# Patient Record
Sex: Male | Born: 1960 | Race: White | Hispanic: No | Marital: Married | Smoking: Never smoker
Health system: Southern US, Community
[De-identification: ages and names within clinical notes are randomized; demographics above are authoritative.]

## PROBLEM LIST (undated history)

## (undated) DIAGNOSIS — E785 Hyperlipidemia, unspecified: Secondary | ICD-10-CM

## (undated) DIAGNOSIS — E669 Obesity, unspecified: Secondary | ICD-10-CM

## (undated) DIAGNOSIS — Z972 Presence of dental prosthetic device (complete) (partial): Secondary | ICD-10-CM

## (undated) DIAGNOSIS — K760 Fatty (change of) liver, not elsewhere classified: Secondary | ICD-10-CM

## (undated) DIAGNOSIS — R739 Hyperglycemia, unspecified: Secondary | ICD-10-CM

## (undated) DIAGNOSIS — M0230 Reiter's disease, unspecified site: Secondary | ICD-10-CM

## (undated) DIAGNOSIS — E119 Type 2 diabetes mellitus without complications: Secondary | ICD-10-CM

## (undated) DIAGNOSIS — M1990 Unspecified osteoarthritis, unspecified site: Secondary | ICD-10-CM

## (undated) DIAGNOSIS — I10 Essential (primary) hypertension: Secondary | ICD-10-CM

## (undated) DIAGNOSIS — R1310 Dysphagia, unspecified: Secondary | ICD-10-CM

## (undated) HISTORY — PX: EYE SURGERY: SHX253

## (undated) HISTORY — PX: BRAIN SURGERY: SHX531

## (undated) HISTORY — PX: CATARACT EXTRACTION: SUR2

## (undated) HISTORY — PX: LEG SURGERY: SHX1003

---
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
## 2004-09-15 ENCOUNTER — Ambulatory Visit: Payer: Self-pay | Admitting: Ophthalmology

## 2004-09-20 ENCOUNTER — Ambulatory Visit: Payer: Self-pay | Admitting: Ophthalmology

## 2006-11-27 ENCOUNTER — Emergency Department: Payer: Self-pay | Admitting: Emergency Medicine

## 2007-07-22 ENCOUNTER — Ambulatory Visit: Payer: Self-pay | Admitting: Emergency Medicine

## 2008-09-20 ENCOUNTER — Emergency Department: Payer: Self-pay | Admitting: Emergency Medicine

## 2008-10-01 ENCOUNTER — Emergency Department: Payer: Self-pay | Admitting: Emergency Medicine

## 2011-07-13 ENCOUNTER — Emergency Department: Payer: Self-pay | Admitting: *Deleted

## 2012-10-16 DIAGNOSIS — M0230 Reiter's disease, unspecified site: Secondary | ICD-10-CM

## 2012-10-16 HISTORY — DX: Reiter's disease, unspecified site: M02.30

## 2014-07-08 DIAGNOSIS — I10 Essential (primary) hypertension: Secondary | ICD-10-CM | POA: Diagnosis not present

## 2014-07-08 DIAGNOSIS — R748 Abnormal levels of other serum enzymes: Secondary | ICD-10-CM | POA: Diagnosis not present

## 2014-07-08 DIAGNOSIS — E784 Other hyperlipidemia: Secondary | ICD-10-CM | POA: Diagnosis not present

## 2014-09-02 DIAGNOSIS — J111 Influenza due to unidentified influenza virus with other respiratory manifestations: Secondary | ICD-10-CM | POA: Diagnosis not present

## 2014-09-23 DIAGNOSIS — Z23 Encounter for immunization: Secondary | ICD-10-CM | POA: Diagnosis not present

## 2014-09-23 DIAGNOSIS — Z9119 Patient's noncompliance with other medical treatment and regimen: Secondary | ICD-10-CM | POA: Diagnosis not present

## 2014-09-23 DIAGNOSIS — I10 Essential (primary) hypertension: Secondary | ICD-10-CM | POA: Diagnosis not present

## 2014-09-23 DIAGNOSIS — M0239 Reiter's disease, multiple sites: Secondary | ICD-10-CM | POA: Diagnosis not present

## 2014-09-23 DIAGNOSIS — M7989 Other specified soft tissue disorders: Secondary | ICD-10-CM | POA: Diagnosis not present

## 2014-10-07 DIAGNOSIS — R739 Hyperglycemia, unspecified: Secondary | ICD-10-CM | POA: Diagnosis not present

## 2014-10-07 DIAGNOSIS — I10 Essential (primary) hypertension: Secondary | ICD-10-CM | POA: Diagnosis not present

## 2014-12-03 ENCOUNTER — Encounter: Payer: Self-pay | Admitting: *Deleted

## 2014-12-03 ENCOUNTER — Emergency Department
Admission: EM | Admit: 2014-12-03 | Discharge: 2014-12-03 | Disposition: A | Discharge status: Home / Self Care | Payer: Commercial Managed Care - HMO | Attending: Emergency Medicine | Admitting: Emergency Medicine

## 2014-12-03 DIAGNOSIS — K645 Perianal venous thrombosis: Secondary | ICD-10-CM | POA: Diagnosis not present

## 2014-12-03 DIAGNOSIS — K644 Residual hemorrhoidal skin tags: Secondary | ICD-10-CM

## 2014-12-03 DIAGNOSIS — R42 Dizziness and giddiness: Secondary | ICD-10-CM | POA: Diagnosis not present

## 2014-12-03 DIAGNOSIS — K625 Hemorrhage of anus and rectum: Secondary | ICD-10-CM | POA: Diagnosis present

## 2014-12-03 DIAGNOSIS — I10 Essential (primary) hypertension: Secondary | ICD-10-CM | POA: Insufficient documentation

## 2014-12-03 HISTORY — DX: Essential (primary) hypertension: I10

## 2014-12-03 HISTORY — DX: Unspecified osteoarthritis, unspecified site: M19.90

## 2014-12-03 LAB — COMPREHENSIVE METABOLIC PANEL
ALBUMIN: 4.2 g/dL (ref 3.5–5.0)
ALK PHOS: 115 U/L (ref 38–126)
ALT: 20 U/L (ref 17–63)
AST: 17 U/L (ref 15–41)
Anion gap: 5 (ref 5–15)
BUN: 18 mg/dL (ref 6–20)
CALCIUM: 8.9 mg/dL (ref 8.9–10.3)
CO2: 28 mmol/L (ref 22–32)
CREATININE: 0.94 mg/dL (ref 0.61–1.24)
Chloride: 104 mmol/L (ref 101–111)
GFR calc non Af Amer: >60 mL/min (ref >60)
GLUCOSE: 107 mg/dL — AB (ref 65–99)
Potassium: 4.2 mmol/L (ref 3.5–5.1)
SODIUM: 137 mmol/L (ref 135–145)
Total Bilirubin: 0.5 mg/dL (ref 0.3–1.2)
Total Protein: 7.4 g/dL (ref 6.5–8.1)

## 2014-12-03 LAB — CBC
HCT: 41.2 % (ref 40.0–52.0)
Hemoglobin: 13.7 g/dL (ref 13.0–18.0)
MCH: 29 pg (ref 26.0–34.0)
MCHC: 33.4 g/dL (ref 32.0–36.0)
MCV: 87 fL (ref 80.0–100.0)
Platelets: 248 10*3/uL (ref 150–440)
RBC: 4.73 MIL/uL (ref 4.40–5.90)
RDW: 13.6 % (ref 11.5–14.5)
WBC: 5.6 10*3/uL (ref 3.8–10.6)

## 2014-12-03 MED ORDER — OXYCODONE-ACETAMINOPHEN 5-325 MG PO TABS
1.0000 | ORAL_TABLET | Freq: Once | ORAL | Status: AC
  Administered 2014-12-03: 1 via ORAL

## 2014-12-03 MED ORDER — LIDOCAINE-EPINEPHRINE-TETRACAINE (LET) SOLUTION
NASAL | Status: AC
  Administered 2014-12-03: 3 mL via TOPICAL
  Filled 2014-12-03: qty 3

## 2014-12-03 MED ORDER — LIDOCAINE-EPINEPHRINE 2 %-1:100000 IJ SOLN
20.0000 mL | Freq: Once | INTRAMUSCULAR | Status: AC
  Administered 2014-12-03: 20 mL

## 2014-12-03 MED ORDER — OXYCODONE-ACETAMINOPHEN 5-325 MG PO TABS
ORAL_TABLET | ORAL | Status: AC
  Administered 2014-12-03: 1 via ORAL
  Filled 2014-12-03: qty 1

## 2014-12-03 MED ORDER — LIDOCAINE-EPINEPHRINE (PF) 1 %-1:200000 IJ SOLN
INTRAMUSCULAR | Status: AC
  Filled 2014-12-03: qty 30

## 2014-12-03 MED ORDER — TUCKS 50 % EX PADS: 1.0000 "application " | MEDICATED_PAD | Freq: Three times a day (TID) | CUTANEOUS | Status: DC

## 2014-12-03 MED ORDER — LIDOCAINE-EPINEPHRINE-TETRACAINE (LET) SOLUTION
3.0000 mL | Freq: Once | NASAL | Status: AC
  Administered 2014-12-03: 3 mL via TOPICAL

## 2014-12-03 MED ORDER — DOCUSATE SODIUM 100 MG PO CAPS: 200.0000 mg | ORAL_CAPSULE | Freq: Two times a day (BID) | ORAL | Status: DC

## 2014-12-03 NOTE — ED Notes (Signed)
Pt states that he feels like this is his hemorrhoids. Has been using preparation H for comfort.
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
## 2014-12-03 NOTE — Discharge Instructions (Signed)

## 2014-12-03 NOTE — ED Notes (Addendum)
Pt reports dark red rectal bleeding, without clotting, has become worse over a three day period. Pt denies n/v.

## 2015-02-05 ENCOUNTER — Encounter: Payer: Self-pay | Admitting: Family Medicine

## 2015-02-05 ENCOUNTER — Ambulatory Visit (INDEPENDENT_AMBULATORY_CARE_PROVIDER_SITE_OTHER): Payer: Commercial Managed Care - HMO | Admitting: Family Medicine

## 2015-02-05 VITALS — BP 148/88 | HR 66 | Temp 98.8°F | Resp 18 | Ht 71.0 in | Wt 210.2 lb

## 2015-02-05 DIAGNOSIS — I10 Essential (primary) hypertension: Secondary | ICD-10-CM | POA: Insufficient documentation

## 2015-02-05 DIAGNOSIS — E785 Hyperlipidemia, unspecified: Secondary | ICD-10-CM

## 2015-02-05 MED ORDER — HYDROCHLOROTHIAZIDE 12.5 MG PO TABS: 12.5000 mg | ORAL_TABLET | Freq: Every day | ORAL | Status: DC

## 2015-02-05 MED ORDER — AMLODIPINE BESYLATE 10 MG PO TABS: 10.0000 mg | ORAL_TABLET | Freq: Every day | ORAL | Status: DC

## 2015-02-05 MED ORDER — ATENOLOL 50 MG PO TABS: 50.0000 mg | ORAL_TABLET | Freq: Every day | ORAL | Status: DC

## 2015-02-05 MED ORDER — SPIRONOLACTONE 25 MG PO TABS: 25.0000 mg | ORAL_TABLET | Freq: Two times a day (BID) | ORAL | Status: DC
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
## 2015-03-11 ENCOUNTER — Ambulatory Visit: Payer: Commercial Managed Care - HMO | Admitting: Family Medicine

## 2015-03-16 DIAGNOSIS — H2000 Unspecified acute and subacute iridocyclitis: Secondary | ICD-10-CM | POA: Diagnosis not present

## 2015-03-23 ENCOUNTER — Emergency Department: Payer: Commercial Managed Care - HMO

## 2015-03-23 ENCOUNTER — Emergency Department
Admission: EM | Admit: 2015-03-23 | Discharge: 2015-03-23 | Disposition: A | Discharge status: Home / Self Care | Payer: Commercial Managed Care - HMO | Attending: Emergency Medicine | Admitting: Emergency Medicine

## 2015-03-23 DIAGNOSIS — T18108A Unspecified foreign body in esophagus causing other injury, initial encounter: Secondary | ICD-10-CM | POA: Diagnosis not present

## 2015-03-23 DIAGNOSIS — Y9289 Other specified places as the place of occurrence of the external cause: Secondary | ICD-10-CM | POA: Diagnosis not present

## 2015-03-23 DIAGNOSIS — Y9389 Activity, other specified: Secondary | ICD-10-CM | POA: Diagnosis not present

## 2015-03-23 DIAGNOSIS — R05 Cough: Secondary | ICD-10-CM | POA: Diagnosis not present

## 2015-03-23 DIAGNOSIS — Y998 Other external cause status: Secondary | ICD-10-CM | POA: Diagnosis not present

## 2015-03-23 DIAGNOSIS — Z79899 Other long term (current) drug therapy: Secondary | ICD-10-CM | POA: Insufficient documentation

## 2015-03-23 DIAGNOSIS — T18128A Food in esophagus causing other injury, initial encounter: Secondary | ICD-10-CM | POA: Insufficient documentation

## 2015-03-23 DIAGNOSIS — X58XXXA Exposure to other specified factors, initial encounter: Secondary | ICD-10-CM | POA: Insufficient documentation

## 2015-03-23 DIAGNOSIS — I10 Essential (primary) hypertension: Secondary | ICD-10-CM | POA: Insufficient documentation

## 2015-03-23 DIAGNOSIS — R079 Chest pain, unspecified: Secondary | ICD-10-CM | POA: Diagnosis not present

## 2015-03-23 DIAGNOSIS — R0989 Other specified symptoms and signs involving the circulatory and respiratory systems: Secondary | ICD-10-CM | POA: Diagnosis present

## 2015-03-23 LAB — BASIC METABOLIC PANEL
Anion gap: 7 (ref 5–15)
BUN: 20 mg/dL (ref 6–20)
CO2: 29 mmol/L (ref 22–32)
Calcium: 9.3 mg/dL (ref 8.9–10.3)
Chloride: 102 mmol/L (ref 101–111)
Creatinine, Ser: 0.94 mg/dL (ref 0.61–1.24)
GFR calc Af Amer: >60 mL/min (ref >60)
GFR calc non Af Amer: >60 mL/min (ref >60)
Glucose, Bld: 115 mg/dL — ABNORMAL HIGH (ref 65–99)
Potassium: 4.8 mmol/L (ref 3.5–5.1)
Sodium: 138 mmol/L (ref 135–145)

## 2015-03-23 LAB — CBC
HCT: 45.4 % (ref 40.0–52.0)
HEMOGLOBIN: 15.1 g/dL (ref 13.0–18.0)
MCH: 28.8 pg (ref 26.0–34.0)
MCHC: 33.3 g/dL (ref 32.0–36.0)
MCV: 86.5 fL (ref 80.0–100.0)
Platelets: 303 10*3/uL (ref 150–440)
RBC: 5.25 MIL/uL (ref 4.40–5.90)
RDW: 13.9 % (ref 11.5–14.5)
WBC: 11.3 10*3/uL — ABNORMAL HIGH (ref 3.8–10.6)

## 2015-03-23 LAB — TROPONIN I: Troponin I: <0.03 ng/mL (ref <0.031)

## 2015-03-23 MED ORDER — IOHEXOL 300 MG/ML  SOLN
75.0000 mL | Freq: Once | INTRAMUSCULAR | Status: AC | PRN
  Administered 2015-03-23: 75 mL via INTRAVENOUS
  Filled 2015-03-23: qty 75

## 2015-03-23 NOTE — ED Notes (Signed)
Patient reports eating around 13:30 and he felt like he was choking and vomited.  Since this last meal patient reports coughing a lot of phlegm and developed chest discomfort, anxiety, and hard time catching breath.
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
## 2015-03-23 NOTE — Discharge Instructions (Signed)
Swallowed Foreign Body, Adult °You have swallowed an object (foreign body). Once the foreign body has passed through the food tube (esophagus), which leads from the mouth to the stomach, it will usually continue through the body without problems. This is because the point where the esophagus enters into the stomach is the narrowest place through which the foreign body must pass. °Sometimes the foreign body gets stuck. The most common type of foreign body obstruction in adults is food impaction. Many times, bones from fish or meat products may become lodged in the esophagus or injure the throat on the way down. When there is an object that obstructs the esophagus, the most obvious symptoms are pain and the inability to swallow normally. In some cases, foreign bodies that can be life threatening are swallowed. Examples of these are certain medications and illicit drugs. Often in these instances, patients are afraid of telling what they swallowed. However, it is extremely important to tell the emergency caregiver what was swallowed because life-saving treatment may be needed.  °X-ray exams may be taken to find the location of the foreign body. However, some objects do not show up well or may be too small to be seen on an X-ray image. If the foreign body is too large or too sharp, it may be too dangerous to allow it to pass on its own. You may need to see a caregiver who specializes in the digestive system (gastroenterologist). In a few cases, a specialist may need to remove the object using a method called "endoscopy".  This involves passing a thin, soft, flexible tube into the food pipe to locate and remove the object. Follow up with your primary doctor or the referral you were given by the emergency caregiver. °HOME CARE INSTRUCTIONS  °· If your caregiver says it is safe for you to eat, then only have liquids and soft foods until your symptoms improve. °· Once you are eating normally: °¨ Cut food into small  pieces. °¨ Remove small bones from food. °¨ Remove large seeds and pits from fruit. °¨ Chew your food well. °¨ Do not talk, laugh, or engage in physical activity while eating or swallowing. °SEEK MEDICAL CARE IF: °· You develop worsening shortness of breath, uncontrollable coughing, chest pains or high fever, greater than 102° F (38.9° C). °· You are unable to eat or drink or you feel that food is getting stuck in your throat. °· You have choking symptoms or cannot stop drooling. °· You develop abdominal pain, vomiting (especially of blood), or rectal bleeding. °MAKE SURE YOU:  °· Understand these instructions. °· Will watch your condition. °· Will get help right away if you are not doing well or get worse. °Document Released: 12/01/2009 Document Revised: 09/05/2011 Document Reviewed: 12/01/2009 °[employer]® Patient Information ©2015 [employer]. This information is not intended to replace advice given to you by your health care provider. Make sure you discuss any questions you have with your health care provider. ° °

## 2015-03-25 ENCOUNTER — Encounter: Payer: Self-pay | Admitting: Family Medicine

## 2015-03-25 ENCOUNTER — Ambulatory Visit (INDEPENDENT_AMBULATORY_CARE_PROVIDER_SITE_OTHER): Payer: Commercial Managed Care - HMO | Admitting: Family Medicine

## 2015-03-25 VITALS — BP 140/78 | HR 65 | Temp 98.0°F | Resp 18 | Ht 71.0 in | Wt 209.8 lb

## 2015-03-25 DIAGNOSIS — I10 Essential (primary) hypertension: Secondary | ICD-10-CM | POA: Diagnosis not present

## 2015-03-25 DIAGNOSIS — Z23 Encounter for immunization: Secondary | ICD-10-CM | POA: Diagnosis not present

## 2015-03-25 MED ORDER — HYDROCHLOROTHIAZIDE 12.5 MG PO TABS: 12.5000 mg | ORAL_TABLET | Freq: Every day | ORAL | Status: DC

## 2015-03-25 MED ORDER — ATENOLOL 50 MG PO TABS: 50.0000 mg | ORAL_TABLET | Freq: Every day | ORAL | Status: DC

## 2015-03-25 MED ORDER — SPIRONOLACTONE 25 MG PO TABS: 25.0000 mg | ORAL_TABLET | Freq: Two times a day (BID) | ORAL | Status: DC

## 2015-03-25 MED ORDER — AMLODIPINE BESYLATE 10 MG PO TABS: 10.0000 mg | ORAL_TABLET | Freq: Every day | ORAL | Status: DC
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
## 2015-03-30 DIAGNOSIS — E785 Hyperlipidemia, unspecified: Secondary | ICD-10-CM | POA: Diagnosis not present

## 2015-03-31 LAB — COMPREHENSIVE METABOLIC PANEL
A/G RATIO: 1.6 (ref 1.1–2.5)
ALK PHOS: 108 IU/L (ref 39–117)
ALT: 25 IU/L (ref 0–44)
AST: 14 IU/L (ref 0–40)
Albumin: 4.4 g/dL (ref 3.5–5.5)
BILIRUBIN TOTAL: 0.4 mg/dL (ref 0.0–1.2)
BUN/Creatinine Ratio: 17 (ref 9–20)
BUN: 15 mg/dL (ref 6–24)
CALCIUM: 9.4 mg/dL (ref 8.7–10.2)
CHLORIDE: 96 mmol/L — AB (ref 97–108)
CO2: 26 mmol/L (ref 18–29)
Creatinine, Ser: 0.89 mg/dL (ref 0.76–1.27)
GFR calc Af Amer: 112 mL/min/{1.73_m2} (ref >59)
GFR calc non Af Amer: 97 mL/min/{1.73_m2} (ref >59)
Globulin, Total: 2.8 g/dL (ref 1.5–4.5)
Glucose: 102 mg/dL — ABNORMAL HIGH (ref 65–99)
POTASSIUM: 4.5 mmol/L (ref 3.5–5.2)
Sodium: 138 mmol/L (ref 134–144)
Total Protein: 7.2 g/dL (ref 6.0–8.5)

## 2015-03-31 LAB — LIPID PANEL
CHOL/HDL RATIO: 3.4 ratio (ref 0.0–5.0)
Cholesterol, Total: 217 mg/dL — ABNORMAL HIGH (ref 100–199)
HDL: 64 mg/dL (ref >39)
LDL Calculated: 112 mg/dL — ABNORMAL HIGH (ref 0–99)
Triglycerides: 204 mg/dL — ABNORMAL HIGH (ref 0–149)
VLDL Cholesterol Cal: 41 mg/dL — ABNORMAL HIGH (ref 5–40)

## 2015-04-07 ENCOUNTER — Encounter: Payer: Self-pay | Admitting: Family Medicine

## 2015-04-07 ENCOUNTER — Ambulatory Visit (INDEPENDENT_AMBULATORY_CARE_PROVIDER_SITE_OTHER): Payer: Self-pay | Admitting: Family Medicine

## 2015-04-07 VITALS — BP 139/77 | HR 60 | Temp 97.7°F | Resp 19 | Ht 71.0 in | Wt 209.5 lb

## 2015-04-07 DIAGNOSIS — E669 Obesity, unspecified: Secondary | ICD-10-CM

## 2015-04-07 DIAGNOSIS — E785 Hyperlipidemia, unspecified: Secondary | ICD-10-CM

## 2015-04-07 DIAGNOSIS — J302 Other seasonal allergic rhinitis: Secondary | ICD-10-CM | POA: Insufficient documentation

## 2015-04-07 HISTORY — DX: Obesity, unspecified: E66.9

## 2015-04-07 MED ORDER — ATORVASTATIN CALCIUM 10 MG PO TABS: 10.0000 mg | ORAL_TABLET | Freq: Every day | ORAL | Status: DC
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
## 2015-05-11 ENCOUNTER — Telehealth: Payer: Self-pay | Admitting: Family Medicine

## 2015-05-11 DIAGNOSIS — H2000 Unspecified acute and subacute iridocyclitis: Secondary | ICD-10-CM | POA: Diagnosis not present

## 2015-05-11 NOTE — Telephone Encounter (Signed)
Patient has infection in his eye and [facility] is needing a referral for him to be seen today. They told him to come in now.

## 2015-05-11 NOTE — Telephone Encounter (Signed)
[facility] referral obtained for patient to be seen today by Dr. Valda Favia # [id number] ICD-10: Z01.00 Start - 05/11/2015 Expires - 11/07/2015

## 2015-06-09 ENCOUNTER — Encounter: Payer: Self-pay | Admitting: Family Medicine

## 2015-06-09 ENCOUNTER — Ambulatory Visit (INDEPENDENT_AMBULATORY_CARE_PROVIDER_SITE_OTHER): Payer: Self-pay | Admitting: Family Medicine

## 2015-06-09 VITALS — BP 116/64 | HR 60 | Temp 98.2°F | Resp 16 | Ht 71.0 in | Wt 213.7 lb

## 2015-06-09 DIAGNOSIS — I10 Essential (primary) hypertension: Secondary | ICD-10-CM

## 2015-06-09 MED ORDER — SPIRONOLACTONE 25 MG PO TABS: 25.0000 mg | ORAL_TABLET | Freq: Two times a day (BID) | ORAL | Status: DC

## 2015-06-09 MED ORDER — ATENOLOL 50 MG PO TABS: 50.0000 mg | ORAL_TABLET | Freq: Every day | ORAL | Status: DC

## 2015-06-09 MED ORDER — HYDROCHLOROTHIAZIDE 12.5 MG PO TABS: 12.5000 mg | ORAL_TABLET | Freq: Every day | ORAL | Status: DC

## 2015-06-09 MED ORDER — AMLODIPINE BESYLATE 10 MG PO TABS: 10.0000 mg | ORAL_TABLET | Freq: Every day | ORAL | Status: DC
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
## 2015-07-08 ENCOUNTER — Ambulatory Visit: Payer: Self-pay | Admitting: Family Medicine

## 2015-08-07 ENCOUNTER — Ambulatory Visit (INDEPENDENT_AMBULATORY_CARE_PROVIDER_SITE_OTHER): Payer: Commercial Managed Care - HMO | Admitting: Family Medicine

## 2015-08-07 ENCOUNTER — Encounter: Payer: Self-pay | Admitting: Family Medicine

## 2015-08-07 VITALS — BP 122/78 | HR 50 | Temp 97.9°F | Resp 18 | Ht 71.0 in | Wt 213.4 lb

## 2015-08-07 DIAGNOSIS — E785 Hyperlipidemia, unspecified: Secondary | ICD-10-CM | POA: Diagnosis not present

## 2015-08-07 MED ORDER — ATORVASTATIN CALCIUM 10 MG PO TABS: 10.0000 mg | ORAL_TABLET | Freq: Every day | ORAL | Status: DC
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
## 2015-09-22 DIAGNOSIS — M12871 Other specific arthropathies, not elsewhere classified, right ankle and foot: Secondary | ICD-10-CM | POA: Diagnosis not present

## 2015-09-22 DIAGNOSIS — T17308A Unspecified foreign body in larynx causing other injury, initial encounter: Secondary | ICD-10-CM | POA: Diagnosis not present

## 2015-09-22 DIAGNOSIS — Z23 Encounter for immunization: Secondary | ICD-10-CM | POA: Diagnosis not present

## 2015-09-22 DIAGNOSIS — Z79899 Other long term (current) drug therapy: Secondary | ICD-10-CM | POA: Diagnosis not present

## 2015-09-22 DIAGNOSIS — M47819 Spondylosis without myelopathy or radiculopathy, site unspecified: Secondary | ICD-10-CM | POA: Diagnosis not present

## 2015-09-22 DIAGNOSIS — M6281 Muscle weakness (generalized): Secondary | ICD-10-CM | POA: Diagnosis not present

## 2015-09-22 DIAGNOSIS — I10 Essential (primary) hypertension: Secondary | ICD-10-CM | POA: Diagnosis not present

## 2015-09-22 DIAGNOSIS — M12872 Other specific arthropathies, not elsewhere classified, left ankle and foot: Secondary | ICD-10-CM | POA: Diagnosis not present

## 2015-10-08 DIAGNOSIS — R1310 Dysphagia, unspecified: Secondary | ICD-10-CM | POA: Diagnosis not present

## 2015-10-08 DIAGNOSIS — Z1211 Encounter for screening for malignant neoplasm of colon: Secondary | ICD-10-CM | POA: Diagnosis not present

## 2015-10-08 DIAGNOSIS — T17308A Unspecified foreign body in larynx causing other injury, initial encounter: Secondary | ICD-10-CM | POA: Diagnosis not present

## 2015-10-08 HISTORY — DX: Dysphagia, unspecified: R13.10

## 2015-11-04 ENCOUNTER — Encounter: Payer: Self-pay | Admitting: Family Medicine

## 2015-11-04 ENCOUNTER — Ambulatory Visit (INDEPENDENT_AMBULATORY_CARE_PROVIDER_SITE_OTHER): Payer: Commercial Managed Care - HMO | Admitting: Family Medicine

## 2015-11-04 VITALS — BP 122/75 | HR 55 | Temp 98.5°F | Resp 16 | Ht 71.0 in | Wt 203.4 lb

## 2015-11-04 DIAGNOSIS — I10 Essential (primary) hypertension: Secondary | ICD-10-CM

## 2015-11-04 DIAGNOSIS — R739 Hyperglycemia, unspecified: Secondary | ICD-10-CM

## 2015-11-04 DIAGNOSIS — E785 Hyperlipidemia, unspecified: Secondary | ICD-10-CM | POA: Diagnosis not present

## 2015-11-04 HISTORY — DX: Hyperglycemia, unspecified: R73.9

## 2015-11-04 MED ORDER — HYDROCHLOROTHIAZIDE 12.5 MG PO TABS: 12.5000 mg | ORAL_TABLET | Freq: Every day | ORAL | Status: DC

## 2015-11-04 MED ORDER — SPIRONOLACTONE 25 MG PO TABS: 25.0000 mg | ORAL_TABLET | Freq: Two times a day (BID) | ORAL | Status: DC

## 2015-11-04 MED ORDER — ATENOLOL 50 MG PO TABS: 50.0000 mg | ORAL_TABLET | Freq: Every day | ORAL | Status: DC

## 2015-11-04 MED ORDER — ATORVASTATIN CALCIUM 10 MG PO TABS: 10.0000 mg | ORAL_TABLET | Freq: Every day | ORAL | Status: DC

## 2015-11-04 MED ORDER — AMLODIPINE BESYLATE 10 MG PO TABS: 10.0000 mg | ORAL_TABLET | Freq: Every day | ORAL | Status: DC
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
## 2015-12-09 DIAGNOSIS — K279 Peptic ulcer, site unspecified, unspecified as acute or chronic, without hemorrhage or perforation: Secondary | ICD-10-CM | POA: Diagnosis not present

## 2015-12-09 DIAGNOSIS — D369 Benign neoplasm, unspecified site: Secondary | ICD-10-CM | POA: Diagnosis not present

## 2015-12-09 DIAGNOSIS — K3189 Other diseases of stomach and duodenum: Secondary | ICD-10-CM | POA: Diagnosis not present

## 2015-12-09 DIAGNOSIS — K5730 Diverticulosis of large intestine without perforation or abscess without bleeding: Secondary | ICD-10-CM | POA: Diagnosis not present

## 2015-12-09 DIAGNOSIS — K269 Duodenal ulcer, unspecified as acute or chronic, without hemorrhage or perforation: Secondary | ICD-10-CM | POA: Diagnosis not present

## 2015-12-09 DIAGNOSIS — K222 Esophageal obstruction: Secondary | ICD-10-CM | POA: Diagnosis not present

## 2015-12-09 DIAGNOSIS — D124 Benign neoplasm of descending colon: Secondary | ICD-10-CM | POA: Diagnosis not present

## 2015-12-09 DIAGNOSIS — E785 Hyperlipidemia, unspecified: Secondary | ICD-10-CM | POA: Diagnosis not present

## 2015-12-09 DIAGNOSIS — Z1211 Encounter for screening for malignant neoplasm of colon: Secondary | ICD-10-CM | POA: Diagnosis not present

## 2015-12-09 DIAGNOSIS — K449 Diaphragmatic hernia without obstruction or gangrene: Secondary | ICD-10-CM | POA: Diagnosis not present

## 2015-12-09 DIAGNOSIS — D120 Benign neoplasm of cecum: Secondary | ICD-10-CM | POA: Diagnosis not present

## 2016-01-13 DIAGNOSIS — A048 Other specified bacterial intestinal infections: Secondary | ICD-10-CM | POA: Insufficient documentation

## 2016-02-05 ENCOUNTER — Ambulatory Visit (INDEPENDENT_AMBULATORY_CARE_PROVIDER_SITE_OTHER): Payer: Commercial Managed Care - HMO | Admitting: Family Medicine

## 2016-02-05 ENCOUNTER — Encounter: Payer: Self-pay | Admitting: Family Medicine

## 2016-02-05 VITALS — BP 122/71 | HR 60 | Temp 98.6°F | Resp 15 | Ht 71.0 in | Wt 207.4 lb

## 2016-02-05 DIAGNOSIS — E785 Hyperlipidemia, unspecified: Secondary | ICD-10-CM | POA: Diagnosis not present

## 2016-02-05 DIAGNOSIS — I10 Essential (primary) hypertension: Secondary | ICD-10-CM | POA: Diagnosis not present

## 2016-02-05 DIAGNOSIS — R739 Hyperglycemia, unspecified: Secondary | ICD-10-CM | POA: Diagnosis not present

## 2016-02-05 MED ORDER — ATENOLOL 50 MG PO TABS: 50.0000 mg | ORAL_TABLET | Freq: Every day | ORAL | 0 refills | Status: DC

## 2016-02-05 MED ORDER — HYDROCHLOROTHIAZIDE 12.5 MG PO TABS: 12.5000 mg | ORAL_TABLET | Freq: Every day | ORAL | 0 refills | Status: DC

## 2016-02-05 MED ORDER — AMLODIPINE BESYLATE 10 MG PO TABS: 10.0000 mg | ORAL_TABLET | Freq: Every day | ORAL | 0 refills | Status: DC

## 2016-02-05 MED ORDER — ATORVASTATIN CALCIUM 10 MG PO TABS: 10.0000 mg | ORAL_TABLET | Freq: Every day | ORAL | 0 refills | Status: DC

## 2016-02-05 MED ORDER — SPIRONOLACTONE 25 MG PO TABS: 25.0000 mg | ORAL_TABLET | Freq: Two times a day (BID) | ORAL | 0 refills | Status: DC
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
## 2016-02-08 LAB — POCT GLYCOSYLATED HEMOGLOBIN (HGB A1C): HEMOGLOBIN A1C: 6.2

## 2016-02-09 ENCOUNTER — Other Ambulatory Visit: Payer: Self-pay | Admitting: Family Medicine

## 2016-02-09 DIAGNOSIS — E785 Hyperlipidemia, unspecified: Secondary | ICD-10-CM | POA: Diagnosis not present

## 2016-02-10 LAB — COMPREHENSIVE METABOLIC PANEL
A/G RATIO: 1.8 (ref 1.2–2.2)
ALBUMIN: 4.3 g/dL (ref 3.5–5.5)
ALK PHOS: 122 IU/L — AB (ref 39–117)
ALT: 20 IU/L (ref 0–44)
AST: 14 IU/L (ref 0–40)
BUN / CREAT RATIO: 10 (ref 9–20)
BUN: 9 mg/dL (ref 6–24)
Bilirubin Total: 0.4 mg/dL (ref 0.0–1.2)
CALCIUM: 9.1 mg/dL (ref 8.7–10.2)
CO2: 27 mmol/L (ref 18–29)
CREATININE: 0.86 mg/dL (ref 0.76–1.27)
Chloride: 101 mmol/L (ref 96–106)
GFR calc Af Amer: 113 mL/min/{1.73_m2} (ref >59)
GFR, EST NON AFRICAN AMERICAN: 98 mL/min/{1.73_m2} (ref >59)
GLOBULIN, TOTAL: 2.4 g/dL (ref 1.5–4.5)
Glucose: 99 mg/dL (ref 65–99)
POTASSIUM: 4.3 mmol/L (ref 3.5–5.2)
SODIUM: 143 mmol/L (ref 134–144)
Total Protein: 6.7 g/dL (ref 6.0–8.5)

## 2016-02-10 LAB — LIPID PANEL W/O CHOL/HDL RATIO
Cholesterol, Total: 123 mg/dL (ref 100–199)
HDL: 43 mg/dL (ref >39)
LDL CALC: 57 mg/dL (ref 0–99)
Triglycerides: 116 mg/dL (ref 0–149)
VLDL Cholesterol Cal: 23 mg/dL (ref 5–40)

## 2016-05-09 ENCOUNTER — Encounter: Payer: Self-pay | Admitting: Family Medicine

## 2016-05-09 ENCOUNTER — Ambulatory Visit (INDEPENDENT_AMBULATORY_CARE_PROVIDER_SITE_OTHER): Payer: Commercial Managed Care - HMO | Admitting: Family Medicine

## 2016-05-09 VITALS — BP 122/68 | HR 69 | Temp 97.8°F | Resp 18 | Ht 71.0 in | Wt 206.9 lb

## 2016-05-09 DIAGNOSIS — Z23 Encounter for immunization: Secondary | ICD-10-CM | POA: Diagnosis not present

## 2016-05-09 DIAGNOSIS — I10 Essential (primary) hypertension: Secondary | ICD-10-CM

## 2016-05-09 DIAGNOSIS — E785 Hyperlipidemia, unspecified: Secondary | ICD-10-CM

## 2016-05-09 MED ORDER — ATENOLOL 50 MG PO TABS: 50.0000 mg | ORAL_TABLET | Freq: Every day | ORAL | 0 refills | Status: DC

## 2016-05-09 MED ORDER — SPIRONOLACTONE 25 MG PO TABS: 25.0000 mg | ORAL_TABLET | Freq: Two times a day (BID) | ORAL | 0 refills | Status: DC

## 2016-05-09 MED ORDER — ATORVASTATIN CALCIUM 10 MG PO TABS: 10.0000 mg | ORAL_TABLET | Freq: Every day | ORAL | 0 refills | Status: DC

## 2016-05-09 MED ORDER — AMLODIPINE BESYLATE 10 MG PO TABS: 10.0000 mg | ORAL_TABLET | Freq: Every day | ORAL | 0 refills | Status: DC

## 2016-05-09 MED ORDER — HYDROCHLOROTHIAZIDE 12.5 MG PO TABS: 12.5000 mg | ORAL_TABLET | Freq: Every day | ORAL | 0 refills | Status: DC
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
## 2016-06-23 ENCOUNTER — Encounter: Payer: Self-pay | Admitting: Family Medicine

## 2016-06-23 ENCOUNTER — Ambulatory Visit (INDEPENDENT_AMBULATORY_CARE_PROVIDER_SITE_OTHER): Payer: Commercial Managed Care - HMO | Admitting: Family Medicine

## 2016-06-23 VITALS — BP 124/68 | HR 80 | Temp 98.0°F | Resp 18 | Ht 71.0 in | Wt 208.5 lb

## 2016-06-23 DIAGNOSIS — Z0000 Encounter for general adult medical examination without abnormal findings: Secondary | ICD-10-CM

## 2016-06-23 DIAGNOSIS — Z23 Encounter for immunization: Secondary | ICD-10-CM

## 2016-06-23 DIAGNOSIS — I10 Essential (primary) hypertension: Secondary | ICD-10-CM | POA: Diagnosis not present

## 2016-06-23 DIAGNOSIS — E785 Hyperlipidemia, unspecified: Secondary | ICD-10-CM | POA: Diagnosis not present

## 2016-06-23 DIAGNOSIS — E559 Vitamin D deficiency, unspecified: Secondary | ICD-10-CM | POA: Diagnosis not present

## 2016-06-23 DIAGNOSIS — N429 Disorder of prostate, unspecified: Secondary | ICD-10-CM | POA: Diagnosis not present

## 2016-06-23 LAB — LIPID PANEL
CHOLESTEROL: 156 mg/dL (ref <200)
HDL: 55 mg/dL (ref >40)
LDL CALC: 75 mg/dL (ref <100)
TRIGLYCERIDES: 132 mg/dL (ref <150)
Total CHOL/HDL Ratio: 2.8 Ratio (ref <5.0)
VLDL: 26 mg/dL (ref <30)

## 2016-06-23 LAB — CBC WITH DIFFERENTIAL/PLATELET
BASOS PCT: 0 %
Basophils Absolute: 0 cells/uL (ref 0–200)
EOS PCT: 3 %
Eosinophils Absolute: 153 cells/uL (ref 15–500)
HEMATOCRIT: 43.9 % (ref 38.5–50.0)
Hemoglobin: 14.7 g/dL (ref 13.2–17.1)
LYMPHS PCT: 29 %
Lymphs Abs: 1479 cells/uL (ref 850–3900)
MCH: 28.3 pg (ref 27.0–33.0)
MCHC: 33.5 g/dL (ref 32.0–36.0)
MCV: 84.6 fL (ref 80.0–100.0)
MONO ABS: 408 {cells}/uL (ref 200–950)
MONOS PCT: 8 %
MPV: 9.9 fL (ref 7.5–12.5)
NEUTROS PCT: 60 %
Neutro Abs: 3060 cells/uL (ref 1500–7800)
PLATELETS: 269 10*3/uL (ref 140–400)
RBC: 5.19 MIL/uL (ref 4.20–5.80)
RDW: 13.7 % (ref 11.0–15.0)
WBC: 5.1 10*3/uL (ref 3.8–10.8)

## 2016-06-23 LAB — TSH: TSH: 1.88 m[IU]/L (ref 0.40–4.50)
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
## 2016-06-24 LAB — PSA: PSA: 1.6 ng/mL (ref <=4.0)

## 2016-06-24 LAB — VITAMIN D 25 HYDROXY (VIT D DEFICIENCY, FRACTURES): VIT D 25 HYDROXY: 44 ng/mL (ref 30–100)

## 2016-09-21 ENCOUNTER — Ambulatory Visit: Payer: Commercial Managed Care - HMO | Admitting: Family Medicine

## 2016-09-22 ENCOUNTER — Ambulatory Visit (INDEPENDENT_AMBULATORY_CARE_PROVIDER_SITE_OTHER): Payer: Medicare HMO | Admitting: Family Medicine

## 2016-09-22 ENCOUNTER — Encounter: Payer: Self-pay | Admitting: Family Medicine

## 2016-09-22 DIAGNOSIS — E785 Hyperlipidemia, unspecified: Secondary | ICD-10-CM

## 2016-09-22 DIAGNOSIS — I10 Essential (primary) hypertension: Secondary | ICD-10-CM

## 2016-09-22 MED ORDER — ATORVASTATIN CALCIUM 10 MG PO TABS
10.0000 mg | ORAL_TABLET | Freq: Every day | ORAL | 0 refills | Status: DC
Start: 2016-09-22 — End: 2017-03-27

## 2016-09-22 MED ORDER — SPIRONOLACTONE 25 MG PO TABS: 25.0000 mg | ORAL_TABLET | Freq: Two times a day (BID) | ORAL | 0 refills | Status: DC

## 2016-09-22 MED ORDER — ATENOLOL 50 MG PO TABS: 50.0000 mg | ORAL_TABLET | Freq: Every day | ORAL | 0 refills | Status: DC

## 2016-09-22 MED ORDER — AMLODIPINE BESYLATE 10 MG PO TABS: 10.0000 mg | ORAL_TABLET | Freq: Every day | ORAL | 0 refills | Status: DC

## 2016-09-22 MED ORDER — HYDROCHLOROTHIAZIDE 12.5 MG PO TABS: 12.5000 mg | ORAL_TABLET | Freq: Every day | ORAL | 0 refills | Status: DC
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
## 2016-10-27 IMAGING — CR DG CHEST 2V
2 series · 2 of 2 positions shown · non-contrast
Comparison: None.

CLINICAL DATA: Productive cough today.

EXAM:
CHEST  2 VIEW

[chest pa]
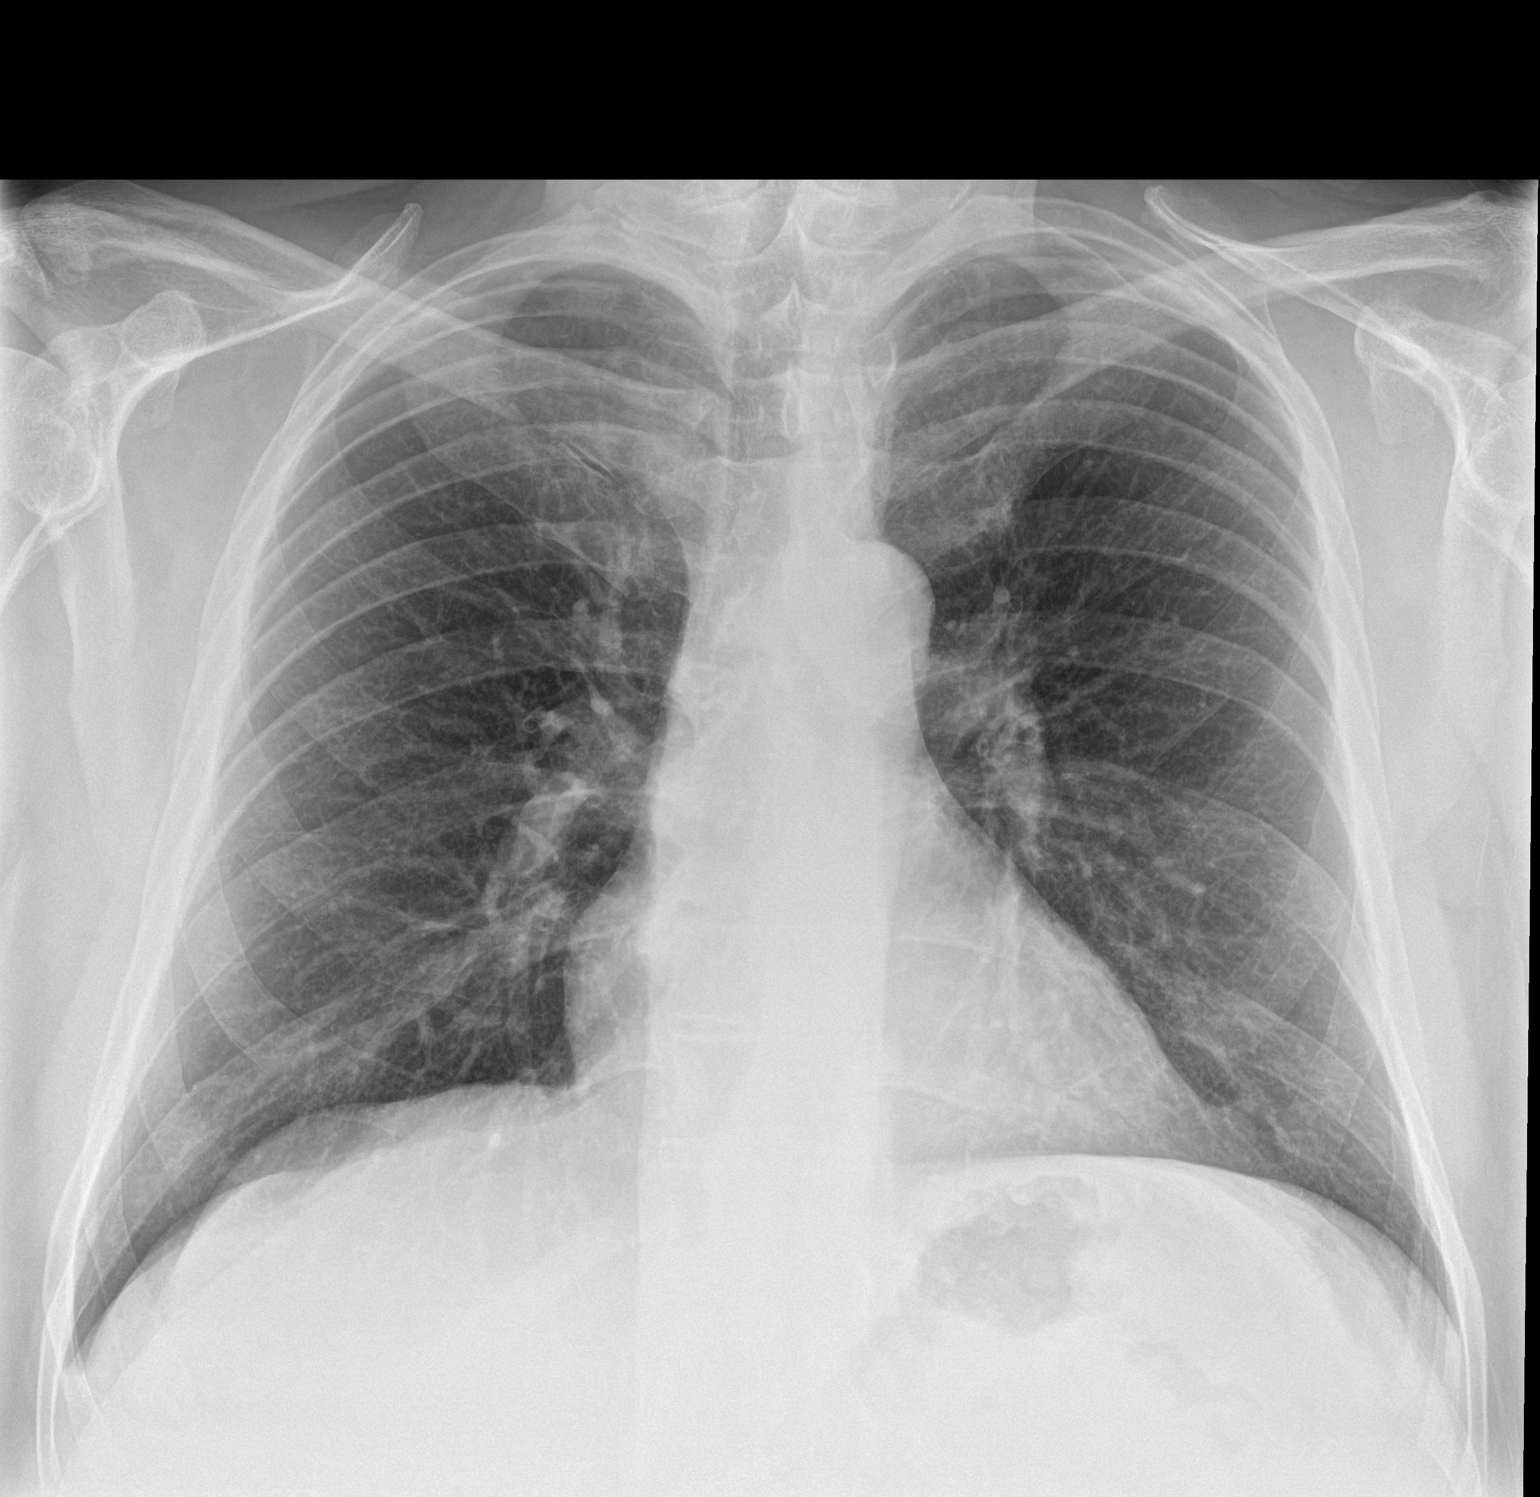

[chest lat]
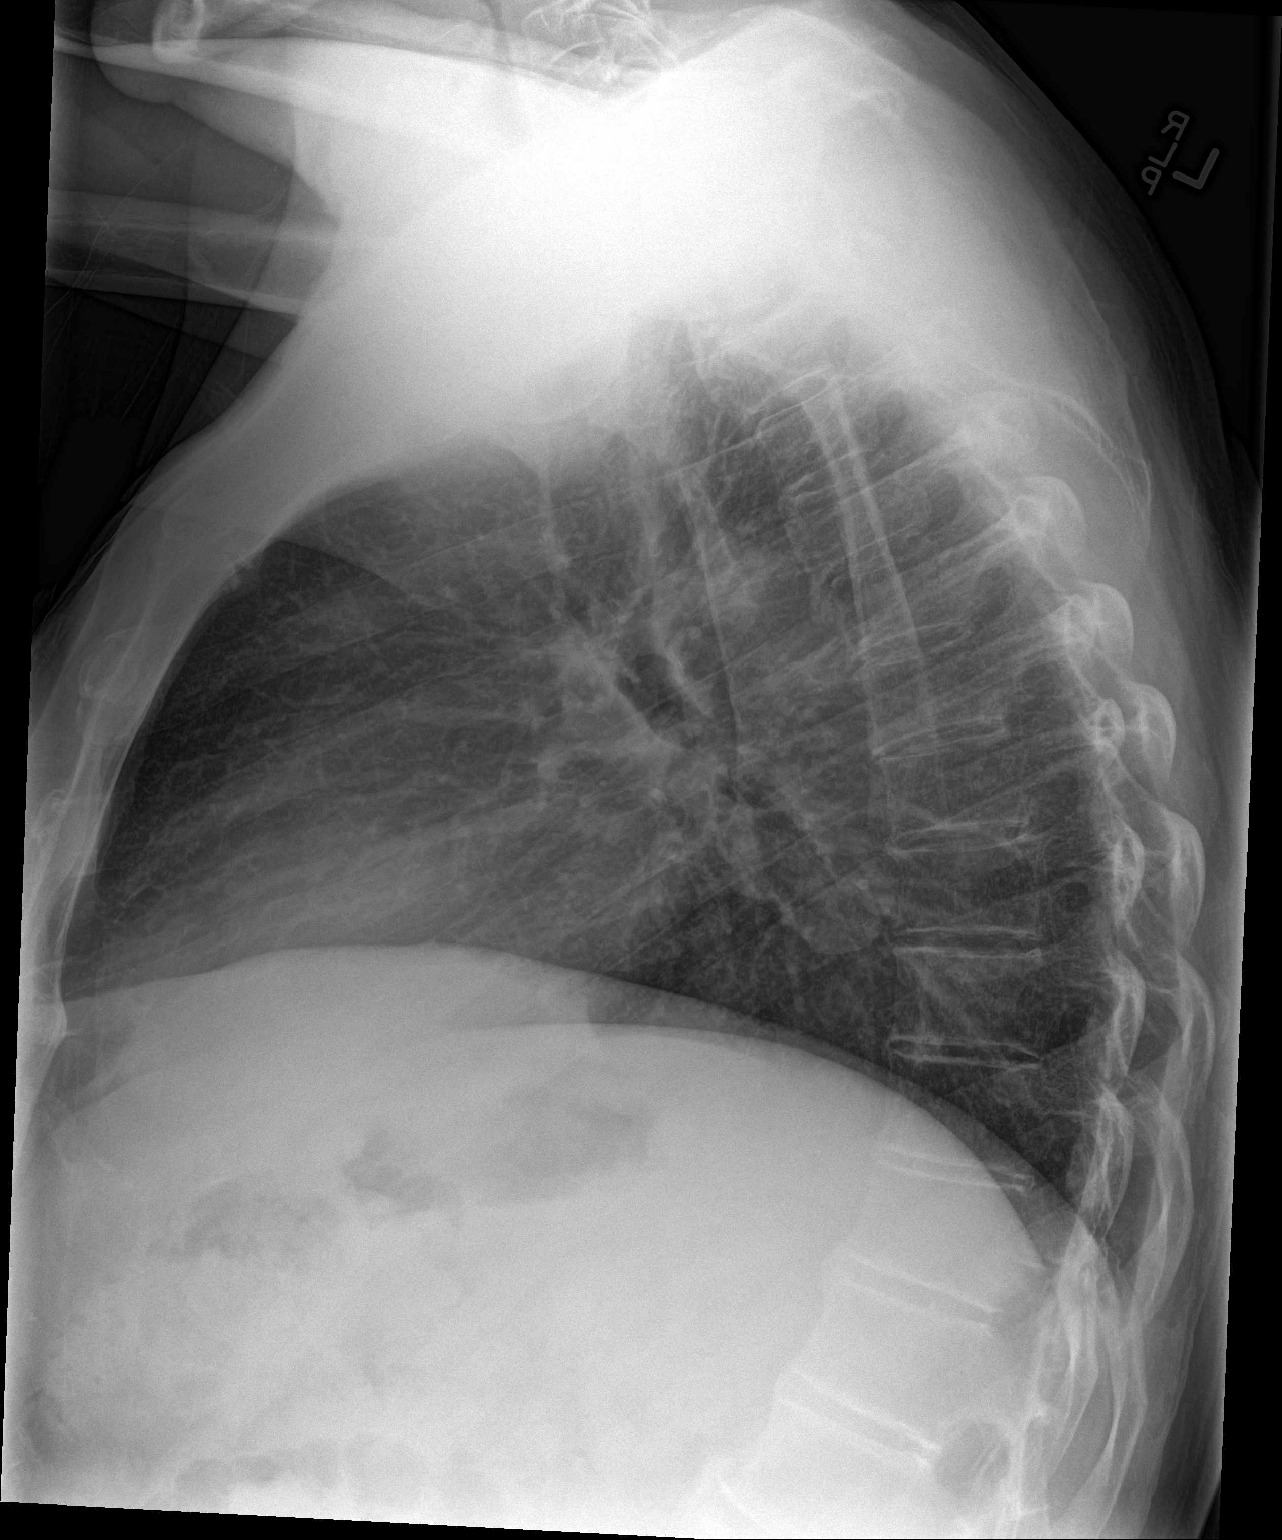

[2 of 2 positions shown; findings below may reference images not displayed]

FINDINGS: The heart size and mediastinal contours are within normal limits.
There is no focal infiltrate, pulmonary edema, or pleural effusion.
The visualized skeletal structures are unremarkable.
IMPRESSION: No active cardiopulmonary disease.

## 2016-10-27 IMAGING — CT CT CHEST W/ CM
1 of 2 series · 14 of 31 positions shown, 18 images · IV contrast (omnipaque)
Comparison: None.

CLINICAL DATA: Choking and vomiting while eating with chest
discomfort, anxiety and difficulty catching breath. Evaluate for
esophageal foreign body.

EXAM:
CT CHEST WITH CONTRAST
TECHNIQUE: Multidetector CT imaging of the chest was performed during
intravenous contrast administration.
CONTRAST:  75mL OMNIPAQUE IOHEXOL 300 MG/ML  SOLN

[Series 2: routine chest with · axial · 0.82mm/px · z∈[-292,-32]mm · 14 of 62 slices shown, 18 images]
[im 5/62  mediastinal]
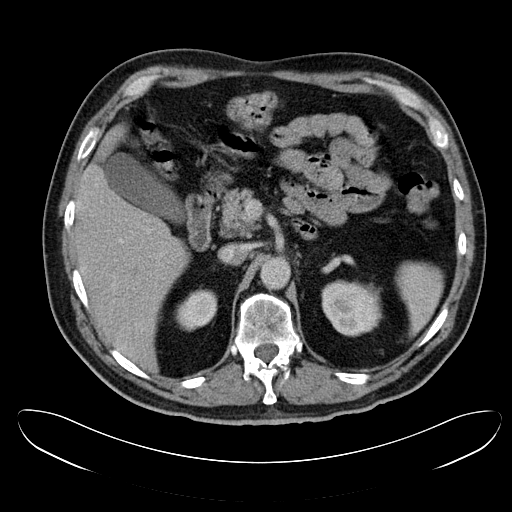
[im 5/62  lung]
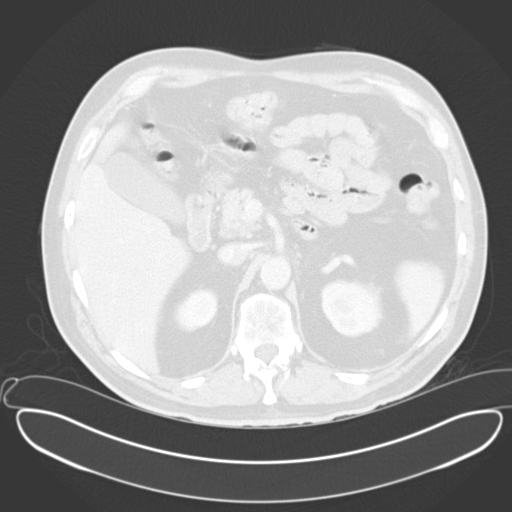
[im 10/62  lung]
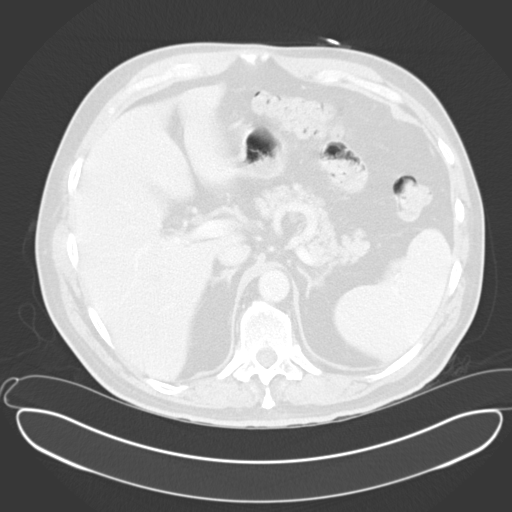
[im 15/62  lung]
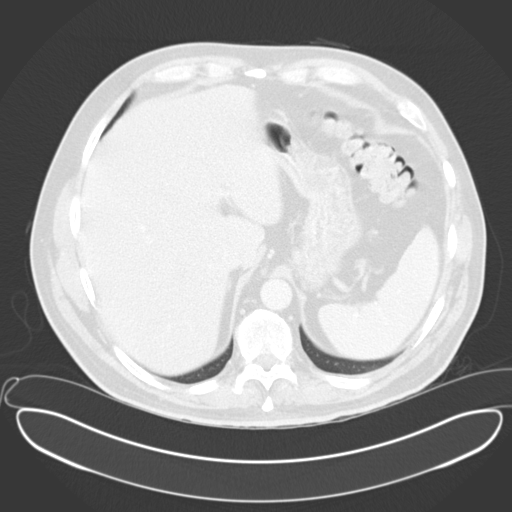
[im 19/62  lung]
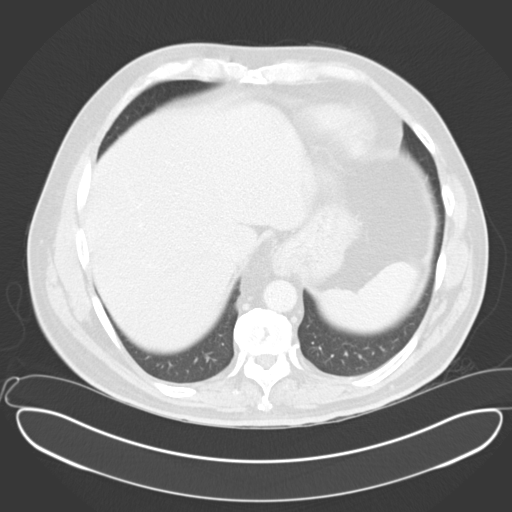
[im 24/62  mediastinal]
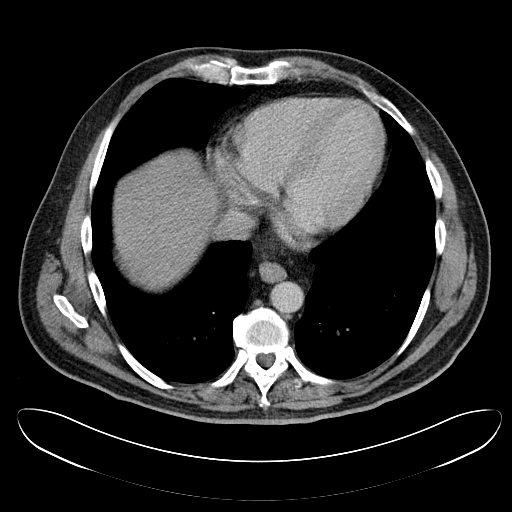
[im 24/62  lung]
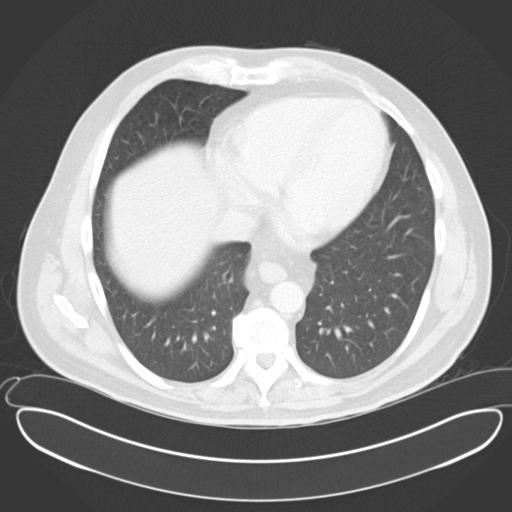
[im 29/62  lung]
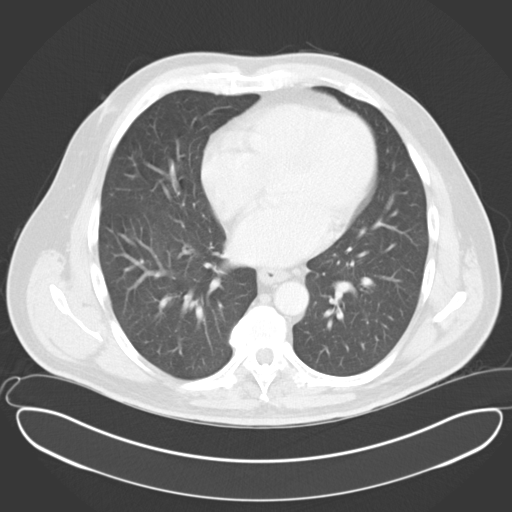
[im 30/62  lung]
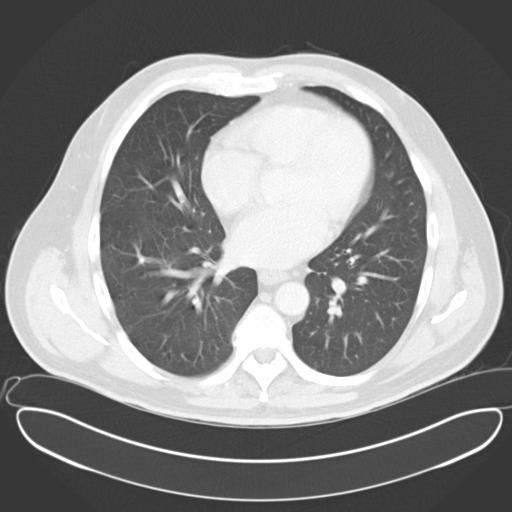
[im 31/62  lung]
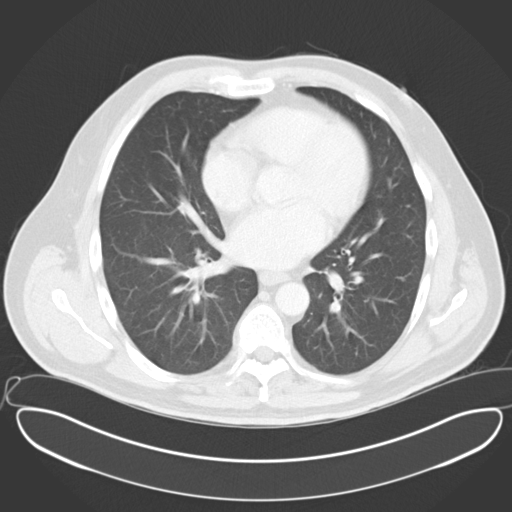
[im 33/62  mediastinal]
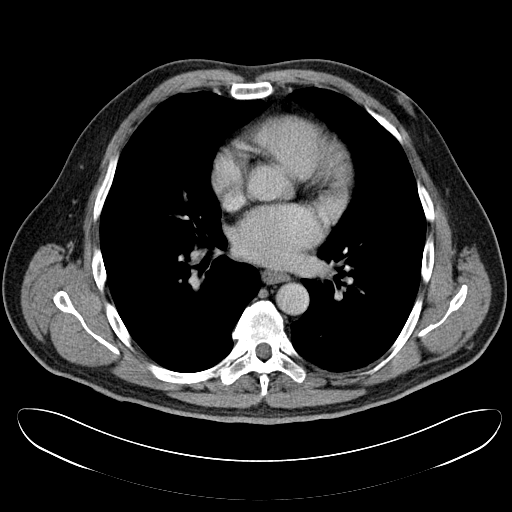
[im 33/62  lung]
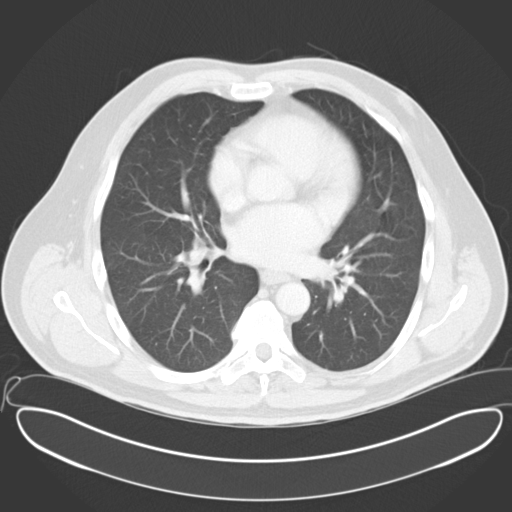
[im 38/62  lung]
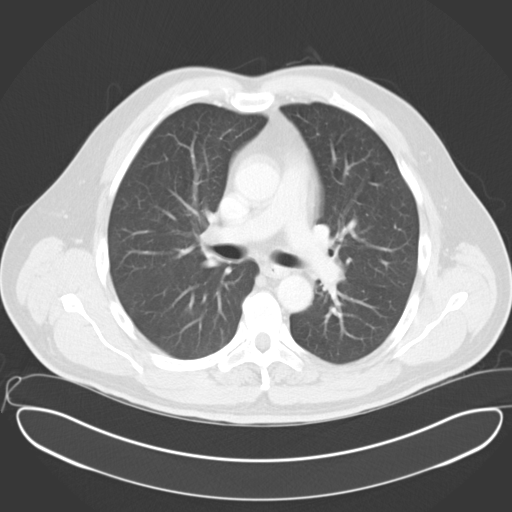
[im 43/62  lung]
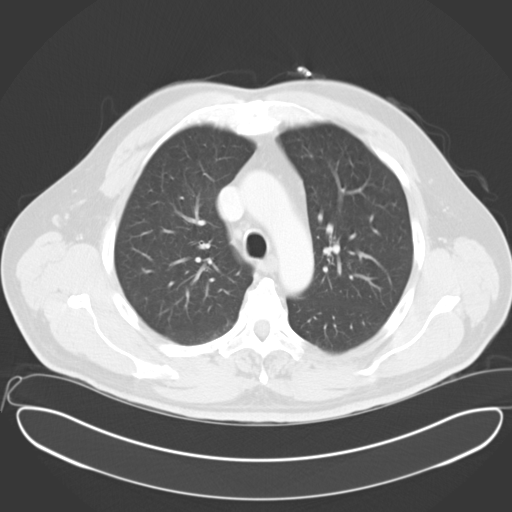
[im 47/62  lung]
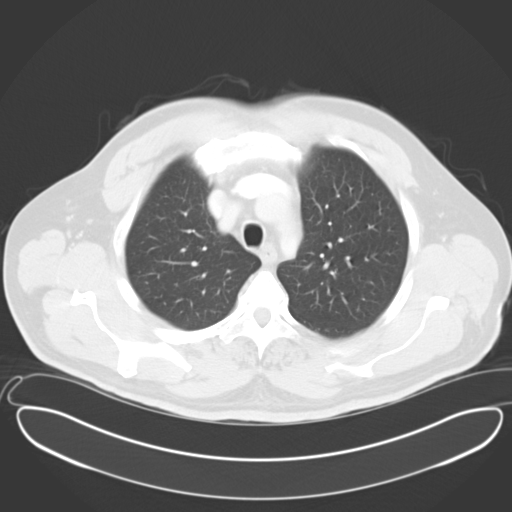
[im 52/62  mediastinal]
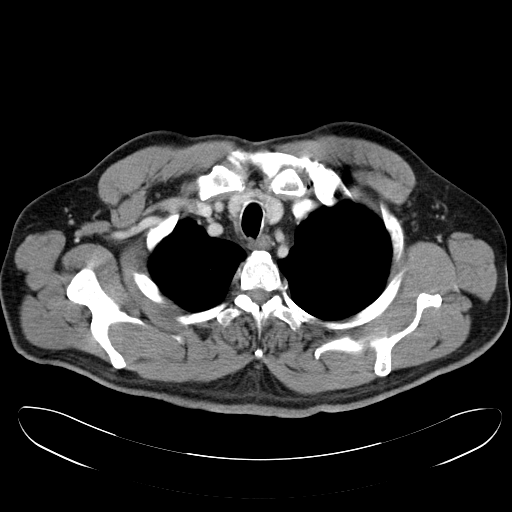
[im 52/62  lung]
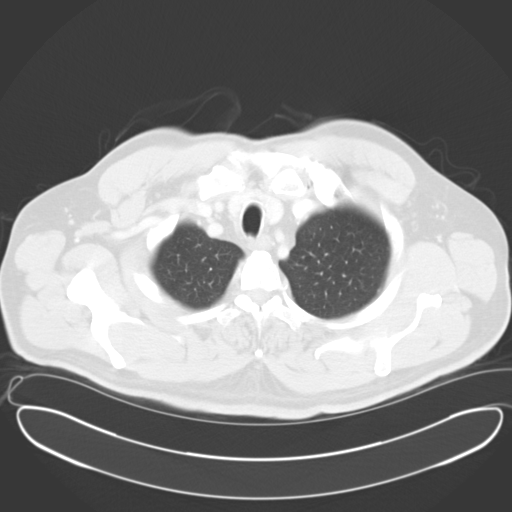
[im 57/62  lung]
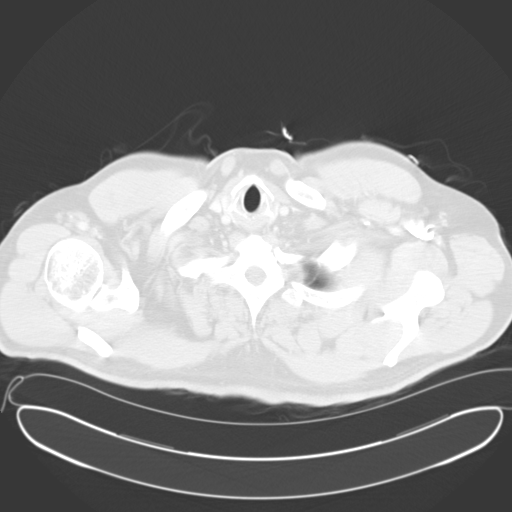

[14 of 31 positions shown; findings below may reference images not displayed]

FINDINGS: THORACIC INLET/BODY WALL:

No acute abnormality.

MEDIASTINUM:

No visible esophageal foreign body. No esophageal distention or
inflammatory change. No pneumomediastinum.

Normal heart size. No pericardial effusion. No acute vascular
abnormality. No adenopathy.

LUNG WINDOWS:

No aspiration pneumonitis.  No collapse or consolidation.

UPPER ABDOMEN:

No acute findings.

OSSEOUS:

No acute finding. Spondylosis or spondylitis with lower thoracic
ankylosis.
IMPRESSION: 1. No evidence of esophageal foreign body. No air leak or aspiration
pneumonitis.
2. Thoracic spondylosis/spondylitis with lower thoracic ankylosis.

## 2017-02-01 ENCOUNTER — Telehealth: Payer: Self-pay | Admitting: Family Medicine

## 2017-02-01 DIAGNOSIS — I10 Essential (primary) hypertension: Secondary | ICD-10-CM

## 2017-02-01 MED ORDER — SPIRONOLACTONE 25 MG PO TABS: 25.0000 mg | ORAL_TABLET | Freq: Two times a day (BID) | ORAL | 0 refills | Status: DC

## 2017-02-01 NOTE — Telephone Encounter (Signed)
Medication has been refilled and sent to [facility] 

## 2017-02-01 NOTE — Telephone Encounter (Signed)
Pt needs refill on Spironolactone to be sent to [facility].

## 2017-02-01 NOTE — Telephone Encounter (Signed)
Pt is scheduled for Sept for his 6 mth FU per Dr Trena Platt last note.

## 2017-02-01 NOTE — Telephone Encounter (Signed)
Melissa please call patient and schedule medication refill appointment  Thank you

## 2017-03-23 ENCOUNTER — Ambulatory Visit: Payer: Medicare HMO | Admitting: Family Medicine

## 2017-03-27 ENCOUNTER — Ambulatory Visit (INDEPENDENT_AMBULATORY_CARE_PROVIDER_SITE_OTHER): Payer: Medicare HMO | Admitting: Family Medicine

## 2017-03-27 ENCOUNTER — Encounter: Payer: Self-pay | Admitting: Family Medicine

## 2017-03-27 VITALS — BP 132/72 | HR 78 | Wt 206.2 lb

## 2017-03-27 DIAGNOSIS — E785 Hyperlipidemia, unspecified: Secondary | ICD-10-CM | POA: Diagnosis not present

## 2017-03-27 DIAGNOSIS — Z23 Encounter for immunization: Secondary | ICD-10-CM | POA: Diagnosis not present

## 2017-03-27 DIAGNOSIS — I10 Essential (primary) hypertension: Secondary | ICD-10-CM

## 2017-03-27 MED ORDER — ATENOLOL 50 MG PO TABS: 50.0000 mg | ORAL_TABLET | Freq: Every day | ORAL | 0 refills | Status: DC

## 2017-03-27 MED ORDER — HYDROCHLOROTHIAZIDE 12.5 MG PO TABS: 12.5000 mg | ORAL_TABLET | Freq: Every day | ORAL | 0 refills | Status: DC

## 2017-03-27 MED ORDER — AMLODIPINE BESYLATE 10 MG PO TABS: 10.0000 mg | ORAL_TABLET | Freq: Every day | ORAL | 0 refills | Status: DC

## 2017-03-27 MED ORDER — ATORVASTATIN CALCIUM 10 MG PO TABS: 10.0000 mg | ORAL_TABLET | Freq: Every day | ORAL | 0 refills | Status: DC

## 2017-03-27 MED ORDER — SPIRONOLACTONE 25 MG PO TABS: 25.0000 mg | ORAL_TABLET | Freq: Two times a day (BID) | ORAL | 0 refills | Status: DC
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
## 2017-03-28 LAB — LIPID PANEL
CHOLESTEROL: 151 mg/dL (ref <200)
HDL: 57 mg/dL (ref >40)
LDL Cholesterol (Calc): 73 mg/dL (calc)
Non-HDL Cholesterol (Calc): 94 mg/dL (calc) (ref <130)
Total CHOL/HDL Ratio: 2.6 (calc) (ref <5.0)
Triglycerides: 125 mg/dL (ref <150)

## 2017-03-29 ENCOUNTER — Telehealth: Payer: Self-pay

## 2017-03-29 NOTE — Telephone Encounter (Signed)
-----   Message from Roselee Nova, MD sent at 03/28/2017  8:59 AM EDT ----- Fasting lipid panel: Normal total cholesterol, HDL, LDL and triglycerides

## 2017-03-29 NOTE — Telephone Encounter (Signed)
Called pt no answer, LM for pt informing of normal lab results.

## 2017-06-09 ENCOUNTER — Ambulatory Visit: Payer: Medicare HMO

## 2017-06-28 ENCOUNTER — Encounter: Payer: Medicare HMO | Admitting: Family Medicine

## 2017-08-17 ENCOUNTER — Ambulatory Visit: Payer: Medicare HMO | Admitting: Family Medicine

## 2017-08-31 ENCOUNTER — Encounter: Payer: Self-pay | Admitting: Family Medicine

## 2017-08-31 ENCOUNTER — Ambulatory Visit (INDEPENDENT_AMBULATORY_CARE_PROVIDER_SITE_OTHER): Payer: Medicare HMO | Admitting: Family Medicine

## 2017-08-31 VITALS — BP 144/90 | HR 98 | Temp 97.7°F | Wt 207.1 lb

## 2017-08-31 DIAGNOSIS — M47819 Spondylosis without myelopathy or radiculopathy, site unspecified: Secondary | ICD-10-CM

## 2017-08-31 DIAGNOSIS — Z125 Encounter for screening for malignant neoplasm of prostate: Secondary | ICD-10-CM

## 2017-08-31 DIAGNOSIS — E785 Hyperlipidemia, unspecified: Secondary | ICD-10-CM

## 2017-08-31 DIAGNOSIS — R0602 Shortness of breath: Secondary | ICD-10-CM

## 2017-08-31 DIAGNOSIS — Z0000 Encounter for general adult medical examination without abnormal findings: Secondary | ICD-10-CM

## 2017-08-31 DIAGNOSIS — M0608 Rheumatoid arthritis without rheumatoid factor, vertebrae: Secondary | ICD-10-CM

## 2017-08-31 DIAGNOSIS — R748 Abnormal levels of other serum enzymes: Secondary | ICD-10-CM | POA: Diagnosis not present

## 2017-08-31 DIAGNOSIS — Z1159 Encounter for screening for other viral diseases: Secondary | ICD-10-CM

## 2017-08-31 DIAGNOSIS — I10 Essential (primary) hypertension: Secondary | ICD-10-CM | POA: Diagnosis not present

## 2017-08-31 DIAGNOSIS — S069XAA Unspecified intracranial injury with loss of consciousness status unknown, initial encounter: Secondary | ICD-10-CM | POA: Insufficient documentation

## 2017-08-31 DIAGNOSIS — G479 Sleep disorder, unspecified: Secondary | ICD-10-CM | POA: Diagnosis not present

## 2017-08-31 DIAGNOSIS — Z0001 Encounter for general adult medical examination with abnormal findings: Secondary | ICD-10-CM | POA: Diagnosis not present

## 2017-08-31 DIAGNOSIS — R739 Hyperglycemia, unspecified: Secondary | ICD-10-CM

## 2017-08-31 DIAGNOSIS — R0683 Snoring: Secondary | ICD-10-CM | POA: Diagnosis not present

## 2017-08-31 DIAGNOSIS — Z23 Encounter for immunization: Secondary | ICD-10-CM

## 2017-08-31 DIAGNOSIS — S069X9A Unspecified intracranial injury with loss of consciousness of unspecified duration, initial encounter: Secondary | ICD-10-CM | POA: Insufficient documentation

## 2017-08-31 DIAGNOSIS — J111 Influenza due to unidentified influenza virus with other respiratory manifestations: Secondary | ICD-10-CM | POA: Insufficient documentation

## 2017-08-31 MED ORDER — SPIRONOLACTONE 25 MG PO TABS: 25.0000 mg | ORAL_TABLET | Freq: Two times a day (BID) | ORAL | 0 refills | Status: DC

## 2017-08-31 MED ORDER — HYDROCHLOROTHIAZIDE 12.5 MG PO TABS: 12.5000 mg | ORAL_TABLET | Freq: Every day | ORAL | 0 refills | Status: DC

## 2017-08-31 MED ORDER — ATENOLOL 50 MG PO TABS: 50.0000 mg | ORAL_TABLET | Freq: Every day | ORAL | 0 refills | Status: DC

## 2017-08-31 MED ORDER — ATORVASTATIN CALCIUM 10 MG PO TABS: 10.0000 mg | ORAL_TABLET | Freq: Every day | ORAL | 0 refills | Status: DC

## 2017-08-31 MED ORDER — AMLODIPINE BESYLATE 10 MG PO TABS: 10.0000 mg | ORAL_TABLET | Freq: Every day | ORAL | 0 refills | Status: DC
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
## 2017-08-31 NOTE — Assessment & Plan Note (Signed)
Encouraged limiting salt; try DASH guidelines; see AVS

## 2017-08-31 NOTE — Assessment & Plan Note (Signed)
Check A1c. 

## 2017-08-31 NOTE — Patient Instructions (Addendum)
Consider getting the new shingles vaccine called Shingrix; that is available for individuals 57 years of age and older, and is recommended even if you have had shingles in the past and/or already received the old shingles vaccine (Zostavax); it is a two-part series, and is available at many local pharmacies We'll have you see the heart doctor and the lung doctor We'll get some labs If you have not heard anything from my staff in a week about any orders/referrals/studies from today, please contact us here to follow-up [phone]  DASH Eating Plan DASH stands for "Dietary Approaches to Stop Hypertension." The DASH eating plan is a healthy eating plan that has been shown to reduce high blood pressure (hypertension). It may also reduce your risk for type 2 diabetes, heart disease, and stroke. The DASH eating plan may also help with weight loss. What are tips for following this plan? General guidelines  Avoid eating more than 2,300 mg (milligrams) of salt (sodium) a day. If you have hypertension, you may need to reduce your sodium intake to 1,500 mg a day.  Limit alcohol intake to no more than 1 drink a day for nonpregnant women and 2 drinks a day for men. One drink equals 12 oz of beer, 5 oz of wine, or 1 oz of hard liquor.  Work with your health care provider to maintain a healthy body weight or to lose weight. Ask what an ideal weight is for you.  Get at least 30 minutes of exercise that causes your heart to beat faster (aerobic exercise) most days of the week. Activities may include walking, swimming, or biking.  Work with your health care provider or diet and nutrition specialist (dietitian) to adjust your eating plan to your individual calorie needs. Reading food labels  Check food labels for the amount of sodium per serving. Choose foods with less than 5 percent of the Daily Value of sodium. Generally, foods with less than 300 mg of sodium per serving fit into this eating plan.  To  find whole grains, look for the word "whole" as the first word in the ingredient list. Shopping  Buy products labeled as "low-sodium" or "no salt added."  Buy fresh foods. Avoid canned foods and premade or frozen meals. Cooking  Avoid adding salt when cooking. Use salt-free seasonings or herbs instead of table salt or sea salt. Check with your health care provider or pharmacist before using salt substitutes.  Do not fry foods. Cook foods using healthy methods such as baking, boiling, grilling, and broiling instead.  Cook with heart-healthy oils, such as olive, canola, soybean, or sunflower oil. Meal planning   Eat a balanced diet that includes: ? 5 or more servings of fruits and vegetables each day. At each meal, try to fill half of your plate with fruits and vegetables. ? Up to 6-8 servings of whole grains each day. ? Less than 6 oz of lean meat, poultry, or fish each day. A 3-oz serving of meat is about the same size as a deck of cards. One egg equals 1 oz. ? 2 servings of low-fat dairy each day. ? A serving of nuts, seeds, or beans 5 times each week. ? Heart-healthy fats. Healthy fats called Omega-3 fatty acids are found in foods such as flaxseeds and coldwater fish, like sardines, salmon, and mackerel.  Limit how much you eat of the following: ? Canned or prepackaged foods. ? Food that is high in trans fat, such as fried foods. ? Food that is  high in saturated fat, such as fatty meat. ? Sweets, desserts, sugary drinks, and other foods with added sugar. ? Full-fat dairy products.  Do not salt foods before eating.  Try to eat at least 2 vegetarian meals each week.  Eat more home-cooked food and less restaurant, buffet, and fast food.  When eating at a restaurant, ask that your food be prepared with less salt or no salt, if possible. What foods are recommended? The items listed may not be a complete list. Talk with your dietitian about what dietary choices are best for  you. Grains Whole-grain or whole-wheat bread. Whole-grain or whole-wheat pasta. Brown rice. [place]. Bulgur. Whole-grain and low-sodium cereals. Pita bread. Low-fat, low-sodium crackers. Whole-wheat flour tortillas. Vegetables Fresh or frozen vegetables (raw, steamed, roasted, or grilled). Low-sodium or reduced-sodium tomato and vegetable juice. Low-sodium or reduced-sodium tomato sauce and tomato paste. Low-sodium or reduced-sodium canned vegetables. Fruits All fresh, dried, or frozen fruit. Canned fruit in natural juice (without added sugar). Meat and other protein foods Skinless chicken or [place]. Ground chicken or [place]. Pork with fat trimmed off. Fish and seafood. Egg whites. Dried beans, peas, or lentils. Unsalted nuts, nut butters, and seeds. Unsalted canned beans. Lean cuts of beef with fat trimmed off. Low-sodium, lean deli meat. Dairy Low-fat (1%) or fat-free (skim) milk. Fat-free, low-fat, or reduced-fat cheeses. Nonfat, low-sodium ricotta or cottage cheese. Low-fat or nonfat yogurt. Low-fat, low-sodium cheese. Fats and oils Soft margarine without trans fats. Vegetable oil. Low-fat, reduced-fat, or light mayonnaise and salad dressings (reduced-sodium). Canola, safflower, olive, soybean, and sunflower oils. Avocado. Seasoning and other foods Herbs. Spices. Seasoning mixes without salt. Unsalted popcorn and pretzels. Fat-free sweets. What foods are not recommended? The items listed may not be a complete list. Talk with your dietitian about what dietary choices are best for you. Grains Baked goods made with fat, such as croissants, muffins, or some breads. Dry pasta or rice meal packs. Vegetables Creamed or fried vegetables. Vegetables in a cheese sauce. Regular canned vegetables (not low-sodium or reduced-sodium). Regular canned tomato sauce and paste (not low-sodium or reduced-sodium). Regular tomato and vegetable juice (not low-sodium or reduced-sodium). Angie Fava.  Olives. Fruits Canned fruit in a light or heavy syrup. Fried fruit. Fruit in cream or butter sauce. Meat and other protein foods Fatty cuts of meat. Ribs. Fried meat. Berniece Salines. Sausage. Bologna and other processed lunch meats. Salami. Fatback. Hotdogs. Bratwurst. Salted nuts and seeds. Canned beans with added salt. Canned or smoked fish. Whole eggs or egg yolks. Chicken or [place] with skin. Dairy Whole or 2% milk, cream, and half-and-half. Whole or full-fat cream cheese. Whole-fat or sweetened yogurt. Full-fat cheese. Nondairy creamers. Whipped toppings. Processed cheese and cheese spreads. Fats and oils Butter. Stick margarine. Lard. Shortening. Ghee. Bacon fat. Tropical oils, such as coconut, palm kernel, or palm oil. Seasoning and other foods Salted popcorn and pretzels. Onion salt, garlic salt, seasoned salt, table salt, and sea salt. Worcestershire sauce. Tartar sauce. Barbecue sauce. Teriyaki sauce. Soy sauce, including reduced-sodium. Steak sauce. Canned and packaged gravies. Fish sauce. Oyster sauce. Cocktail sauce. Horseradish that you find on the shelf. Ketchup. Mustard. Meat flavorings and tenderizers. Bouillon cubes. Hot sauce and Tabasco sauce. Premade or packaged marinades. Premade or packaged taco seasonings. Relishes. Regular salad dressings. Where to find more information:  National Heart, Lung, and [place]: https://wilson-eaton.com/  American Heart Association: www.heart.org Summary  The DASH eating plan is a healthy eating plan that has been shown to reduce high blood pressure (hypertension). It  may also reduce your risk for type 2 diabetes, heart disease, and stroke.  With the DASH eating plan, you should limit salt (sodium) intake to 2,300 mg a day. If you have hypertension, you may need to reduce your sodium intake to 1,500 mg a day.  When on the DASH eating plan, aim to eat more fresh fruits and vegetables, whole grains, lean proteins, low-fat dairy, and heart-healthy  fats.  Work with your health care provider or diet and nutrition specialist (dietitian) to adjust your eating plan to your individual calorie needs. This information is not intended to replace advice given to you by your health care provider. Make sure you discuss any questions you have with your health care provider. Document Released: 06/02/2011 Document Revised: 06/06/2016 Document Reviewed: 06/06/2016 Elsevier Interactive Patient Education  2018 [place] Maintenance, Male A healthy lifestyle and preventive care is important for your health and wellness. Ask your health care provider about what schedule of regular examinations is right for you. What should I know about weight and diet? Eat a Healthy Diet  Eat plenty of vegetables, fruits, whole grains, low-fat dairy products, and lean protein.  Do not eat a lot of foods high in solid fats, added sugars, or salt.  Maintain a Healthy Weight Regular exercise can help you achieve or maintain a healthy weight. You should:  Do at least 150 minutes of exercise each week. The exercise should increase your heart rate and make you sweat (moderate-intensity exercise).  Do strength-training exercises at least twice a week.  Watch Your Levels of Cholesterol and Blood Lipids  Have your blood tested for lipids and cholesterol every 5 years starting at 57 years of age. If you are at high risk for heart disease, you should start having your blood tested when you are 57 years old. You may need to have your cholesterol levels checked more often if: ? Your lipid or cholesterol levels are high. ? You are older than 57 years of age. ? You are at high risk for heart disease.  What should I know about cancer screening? Many types of cancers can be detected early and may often be prevented. Lung Cancer  You should be screened every year for lung cancer if: ? You are a current smoker who has smoked for at least 30 years. ? You are a former  smoker who has quit within the past 15 years.  Talk to your health care provider about your screening options, when you should start screening, and how often you should be screened.  Colorectal Cancer  Routine colorectal cancer screening usually begins at 58 years of age and should be repeated every 5-10 years until you are 57 years old. You may need to be screened more often if early forms of precancerous polyps or small growths are found. Your health care provider may recommend screening at an earlier age if you have risk factors for colon cancer.  Your health care provider may recommend using home test kits to check for hidden blood in the stool.  A small camera at the end of a tube can be used to examine your colon (sigmoidoscopy or colonoscopy). This checks for the earliest forms of colorectal cancer.  Prostate and Testicular Cancer  Depending on your age and overall health, your health care provider may do certain tests to screen for prostate and testicular cancer.  Talk to your health care provider about any symptoms or concerns you have about testicular or prostate cancer.  Skin Cancer  Check your skin from head to toe regularly.  Tell your health care provider about any new moles or changes in moles, especially if: ? There is a change in a mole's size, shape, or color. ? You have a mole that is larger than a pencil eraser.  Always use sunscreen. Apply sunscreen liberally and repeat throughout the day.  Protect yourself by wearing long sleeves, pants, a wide-brimmed hat, and sunglasses when outside.  What should I know about heart disease, diabetes, and high blood pressure?  If you are 29-71 years of age, have your blood pressure checked every 3-5 years. If you are 54 years of age or older, have your blood pressure checked every year. You should have your blood pressure measured twice-once when you are at a hospital or clinic, and once when you are not at a hospital or clinic.  Record the average of the two measurements. To check your blood pressure when you are not at a hospital or clinic, you can use: ? An automated blood pressure machine at a pharmacy. ? A home blood pressure monitor.  Talk to your health care provider about your target blood pressure.  If you are between 70-51 years old, ask your health care provider if you should take aspirin to prevent heart disease.  Have regular diabetes screenings by checking your fasting blood sugar level. ? If you are at a normal weight and have a low risk for diabetes, have this test once every three years after the age of 71. ? If you are overweight and have a high risk for diabetes, consider being tested at a younger age or more often.  A one-time screening for abdominal aortic aneurysm (AAA) by ultrasound is recommended for men aged 39-75 years who are current or former smokers. What should I know about preventing infection? Hepatitis B If you have a higher risk for hepatitis B, you should be screened for this virus. Talk with your health care provider to find out if you are at risk for hepatitis B infection. Hepatitis C Blood testing is recommended for:  Everyone born from [birth year] through 1965.  Anyone with known risk factors for hepatitis C.  Sexually Transmitted Diseases (STDs)  You should be screened each year for STDs including gonorrhea and chlamydia if: ? You are sexually active and are younger than 57 years of age. ? You are older than 57 years of age and your health care provider tells you that you are at risk for this type of infection. ? Your sexual activity has changed since you were last screened and you are at an increased risk for chlamydia or gonorrhea. Ask your health care provider if you are at risk.  Talk with your health care provider about whether you are at high risk of being infected with HIV. Your health care provider may recommend a prescription medicine to help prevent HIV  infection.  What else can I do?  Schedule regular health, dental, and eye exams.  Stay current with your vaccines (immunizations).  Do not use any tobacco products, such as cigarettes, chewing tobacco, and e-cigarettes. If you need help quitting, ask your health care provider.  Limit alcohol intake to no more than 2 drinks per day. One drink equals 12 ounces of beer, 5 ounces of wine, or 1 ounces of hard liquor.  Do not use street drugs.  Do not share needles.  Ask your health care provider for help if you need support or information about quitting  drugs.  Tell your health care provider if you often feel depressed.  Tell your health care provider if you have ever been abused or do not feel safe at home. This information is not intended to replace advice given to you by your health care provider. Make sure you discuss any questions you have with your health care provider. Document Released: 12/10/2007 Document Revised: 02/10/2016 Document Reviewed: 03/17/2015 Elsevier Interactive Patient Education  [employer].

## 2017-08-31 NOTE — Assessment & Plan Note (Signed)
Refer for sleep study evaluation

## 2017-08-31 NOTE — Assessment & Plan Note (Signed)
Diagnosed at age 57 yo; managed at [facility] by rheumatologist

## 2017-08-31 NOTE — Assessment & Plan Note (Signed)
USPSTF grade A and B recommendations reviewed with patient; age-appropriate recommendations, preventive care, screening tests, etc discussed and encouraged; healthy living encouraged; see AVS for patient education given to patient  

## 2017-09-01 ENCOUNTER — Other Ambulatory Visit: Payer: Self-pay | Admitting: Family Medicine

## 2017-09-01 MED ORDER — ATORVASTATIN CALCIUM 20 MG PO TABS
20.0000 mg | ORAL_TABLET | Freq: Every day | ORAL | 0 refills | Status: DC
Start: 2017-09-01 — End: 2018-04-25

## 2017-09-01 NOTE — Progress Notes (Signed)
New Rx to mail order, increase statin Awaiting alk phos isoenzymes Recheck hepatic function panel and lipids in 6 weeks, staff to order

## 2017-09-04 ENCOUNTER — Other Ambulatory Visit: Payer: Self-pay

## 2017-09-04 DIAGNOSIS — R748 Abnormal levels of other serum enzymes: Secondary | ICD-10-CM

## 2017-09-06 ENCOUNTER — Ambulatory Visit (INDEPENDENT_AMBULATORY_CARE_PROVIDER_SITE_OTHER): Payer: Medicare HMO | Admitting: Internal Medicine

## 2017-09-06 ENCOUNTER — Encounter: Payer: Self-pay | Admitting: Internal Medicine

## 2017-09-06 VITALS — BP 132/84 | HR 58 | Ht 71.0 in | Wt 209.0 lb

## 2017-09-06 DIAGNOSIS — G4719 Other hypersomnia: Secondary | ICD-10-CM
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
## 2017-09-06 NOTE — Patient Instructions (Signed)
--

## 2017-09-08 ENCOUNTER — Other Ambulatory Visit: Payer: Self-pay | Admitting: Family Medicine

## 2017-09-08 DIAGNOSIS — R748 Abnormal levels of other serum enzymes: Secondary | ICD-10-CM

## 2017-09-08 LAB — CBC WITH DIFFERENTIAL/PLATELET
BASOS ABS: 31 {cells}/uL (ref 0–200)
Basophils Relative: 0.5 %
EOS ABS: 128 {cells}/uL (ref 15–500)
Eosinophils Relative: 2.1 %
HEMATOCRIT: 42.4 % (ref 38.5–50.0)
Hemoglobin: 14.8 g/dL (ref 13.2–17.1)
LYMPHS ABS: 1690 {cells}/uL (ref 850–3900)
MCH: 28.6 pg (ref 27.0–33.0)
MCHC: 34.9 g/dL (ref 32.0–36.0)
MCV: 82 fL (ref 80.0–100.0)
MONOS PCT: 5.9 %
MPV: 10.1 fL (ref 7.5–12.5)
NEUTROS PCT: 63.8 %
Neutro Abs: 3892 cells/uL (ref 1500–7800)
Platelets: 290 10*3/uL (ref 140–400)
RBC: 5.17 10*6/uL (ref 4.20–5.80)
RDW: 12.8 % (ref 11.0–15.0)
Total Lymphocyte: 27.7 %
WBC mixed population: 360 cells/uL (ref 200–950)
WBC: 6.1 10*3/uL (ref 3.8–10.8)

## 2017-09-08 LAB — LIPID PANEL
CHOLESTEROL: 185 mg/dL (ref <200)
HDL: 51 mg/dL (ref >40)
LDL CHOLESTEROL (CALC): 105 mg/dL — AB
Non-HDL Cholesterol (Calc): 134 mg/dL (calc) — ABNORMAL HIGH (ref <130)
Total CHOL/HDL Ratio: 3.6 (calc) (ref <5.0)
Triglycerides: 163 mg/dL — ABNORMAL HIGH (ref <150)

## 2017-09-08 LAB — ALKALINE PHOSPHATASE ISOENZYMES
ALKALINE PHOSPHATASE (APISO): 132 U/L — AB (ref 40–115)
BONE ISOENZYMES (ALP ISO): 26 % — AB (ref 28–66)
Intestinal Isoenzymes: 0 % — ABNORMAL LOW (ref 1–24)
LIVER ISOENZYMES (ALP ISO): 74 % — AB (ref 25–69)

## 2017-09-08 LAB — COMPLETE METABOLIC PANEL WITH GFR
AG Ratio: 1.5 (calc) (ref 1.0–2.5)
ALBUMIN MSPROF: 4.6 g/dL (ref 3.6–5.1)
ALKALINE PHOSPHATASE (APISO): 125 U/L — AB (ref 40–115)
ALT: 21 U/L (ref 9–46)
AST: 15 U/L (ref 10–35)
BUN: 15 mg/dL (ref 7–25)
CO2: 26 mmol/L (ref 20–32)
Calcium: 9.4 mg/dL (ref 8.6–10.3)
Chloride: 102 mmol/L (ref 98–110)
Creat: 1.07 mg/dL (ref 0.70–1.33)
GFR, Est African American: 89 mL/min/{1.73_m2} (ref >=60)
GFR, Est Non African American: 77 mL/min/{1.73_m2} (ref >=60)
Globulin: 3.1 g/dL (calc) (ref 1.9–3.7)
Glucose, Bld: 121 mg/dL (ref 65–139)
Potassium: 4.1 mmol/L (ref 3.5–5.3)
Sodium: 138 mmol/L (ref 135–146)
TOTAL PROTEIN: 7.7 g/dL (ref 6.1–8.1)
Total Bilirubin: 0.3 mg/dL (ref 0.2–1.2)

## 2017-09-08 LAB — PSA: PSA: 1.4 ng/mL (ref <=4.0)

## 2017-09-08 LAB — HEPATITIS C ANTIBODY
HEP C AB: NONREACTIVE
SIGNAL TO CUT-OFF: 0.02 (ref <1.00)

## 2017-09-08 LAB — TEST AUTHORIZATION

## 2017-09-08 LAB — HEMOGLOBIN A1C
Hgb A1c MFr Bld: 5.7 % of total Hgb — ABNORMAL HIGH (ref <5.7)
MEAN PLASMA GLUCOSE: 117 (calc)
eAG (mmol/L): 6.5 (calc)

## 2017-09-08 NOTE — Assessment & Plan Note (Signed)
Check RUQ [place]

## 2017-09-08 NOTE — Progress Notes (Signed)
Ordered RUQ US

## 2017-09-14 ENCOUNTER — Encounter: Payer: Self-pay | Admitting: Internal Medicine

## 2017-09-14 ENCOUNTER — Telehealth: Payer: Self-pay | Admitting: Internal Medicine

## 2017-09-14 DIAGNOSIS — G4733 Obstructive sleep apnea (adult) (pediatric): Secondary | ICD-10-CM | POA: Diagnosis not present

## 2017-09-14 DIAGNOSIS — G4719 Other hypersomnia: Secondary | ICD-10-CM

## 2017-09-14 NOTE — Telephone Encounter (Signed)
Returned patient's call and he will p/u the device today. Pt's wife will p/u device. Nothing else needed at this time. Rhonda J Cobb

## 2017-09-14 NOTE — Telephone Encounter (Signed)
Pt is returning your call regarding sleep study tonight.

## 2017-09-20 DIAGNOSIS — G4733 Obstructive sleep apnea (adult) (pediatric): Secondary | ICD-10-CM | POA: Diagnosis not present

## 2017-09-22 ENCOUNTER — Telehealth: Payer: Self-pay | Admitting: *Deleted

## 2017-09-22 DIAGNOSIS — G4733 Obstructive sleep apnea (adult) (pediatric): Secondary | ICD-10-CM

## 2017-09-22 NOTE — Telephone Encounter (Signed)
Called patient with results of HST. Left message Mild OSA

## 2017-09-25 NOTE — Telephone Encounter (Signed)
Patient aware of results Orders placed Nothing further needed. 

## 2017-10-03 ENCOUNTER — Encounter: Payer: Self-pay | Admitting: Cardiovascular Disease

## 2017-10-03 ENCOUNTER — Ambulatory Visit: Payer: Medicare HMO | Admitting: Cardiovascular Disease

## 2017-10-03 VITALS — BP 132/78 | HR 57 | Ht 72.0 in | Wt 206.0 lb

## 2017-10-03 DIAGNOSIS — R0602 Shortness of breath: Secondary | ICD-10-CM

## 2017-10-03 DIAGNOSIS — I10 Essential (primary) hypertension: Secondary | ICD-10-CM

## 2017-10-03 DIAGNOSIS — E785 Hyperlipidemia, unspecified: Secondary | ICD-10-CM

## 2017-10-03 NOTE — Patient Instructions (Addendum)
Medication Instructions:  Your physician recommends that you continue on your current medications as directed. Please refer to the Current Medication list given to you today.   Labwork: none  Testing/Procedures: Your physician has requested that you have an exercise tolerance test. For further information please visit [email or web address]. Please also follow instruction sheet, as given.  Wear comfortable walking shoes No caffeine or smoking 24 hours before testing Do not take atenolol the morning of testing.    Follow-Up: Your physician recommends that you schedule a follow-up appointment as needed.    Any Other Special Instructions Will Be Listed Below (If Applicable).     If you need a refill on your cardiac medications before your next appointment, please call your pharmacy.   Exercise Stress Electrocardiogram An exercise stress electrocardiogram is a test that is done to evaluate the blood supply to your heart. This test may also be called exercise stress electrocardiography. The test is done while you are walking on a treadmill. The goal of this test is to raise your heart rate. This test is done to find areas of poor blood flow to the heart by determining the extent of coronary artery disease (CAD). CAD is defined as narrowing in one or more heart (coronary) arteries of more than 70%. If you have an abnormal test result, this may mean that you are not getting adequate blood flow to your heart during exercise. Additional testing may be needed to understand why your test was abnormal. Tell a health care provider about:  Any allergies you have.  All medicines you are taking, including vitamins, herbs, eye drops, creams, and over-the-counter medicines.  Any problems you or family members have had with anesthetic medicines.  Any blood disorders you have.  Any surgeries you have had.  Any medical conditions you have.  Possibility of pregnancy, if this applies. What are the  risks? Generally, this is a safe procedure. However, as with any procedure, complications can occur. Possible complications can include:  Pain or pressure in the following areas: ? Chest. ? Jaw or neck. ? Between your shoulder blades. ? Radiating down your left arm.  Dizziness or light-headedness.  Shortness of breath.  Increased or irregular heartbeats.  Nausea or vomiting.  Heart attack (rare).  What happens before the procedure?  Avoid all forms of caffeine 24 hours before your test or as directed by your health care provider. This includes coffee, tea (even decaffeinated tea), caffeinated sodas, chocolate, cocoa, and certain pain medicines.  Follow your health care provider's instructions regarding eating and drinking before the test.  Take your medicines as directed at regular times with water unless instructed otherwise. Exceptions may include: ? If you have diabetes, ask how you are to take your insulin or pills. It is common to adjust insulin dosing the morning of the test. ? If you are taking beta-blocker medicines, it is important to talk to your health care provider about these medicines well before the date of your test. Taking beta-blocker medicines may interfere with the test. In some cases, these medicines need to be changed or stopped 24 hours or more before the test. ? If you wear a nitroglycerin patch, it may need to be removed prior to the test. Ask your health care provider if the patch should be removed before the test.  If you use an inhaler for any breathing condition, bring it with you to the test.  If you are an outpatient, bring a snack so you can eat right after  the stress phase of the test.  Do not smoke for 4 hours prior to the test or as directed by your health care provider.  Do not apply lotions, powders, creams, or oils on your chest prior to the test.  Wear loose-fitting clothes and comfortable shoes for the test. This test involves walking on a  treadmill. What happens during the procedure?  Multiple patches (electrodes) will be put on your chest. If needed, small areas of your chest may have to be shaved to get better contact with the electrodes. Once the electrodes are attached to your body, multiple wires will be attached to the electrodes and your heart rate will be monitored.  Your heart will be monitored both at rest and while exercising.  You will walk on a treadmill. The treadmill will be started at a slow pace. The treadmill speed and incline will gradually be increased to raise your heart rate. What happens after the procedure?  Your heart rate and blood pressure will be monitored after the test.  You may return to your normal schedule including diet, activities, and medicines, unless your health care provider tells you otherwise. This information is not intended to replace advice given to you by your health care provider. Make sure you discuss any questions you have with your health care provider. Document Released: 06/10/2000 Document Revised: 11/19/2015 Document Reviewed: 02/18/2013 Elsevier Interactive Patient Education  2017 [employer].
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
## 2017-10-06 ENCOUNTER — Ambulatory Visit: Payer: Medicare HMO | Admitting: Family Medicine

## 2017-10-10 ENCOUNTER — Telehealth: Payer: Self-pay | Admitting: Cardiovascular Disease

## 2017-10-10 NOTE — Telephone Encounter (Signed)
Called patient to review GXT instructions. Left message on machine for patient to contact the office.

## 2017-10-10 NOTE — Telephone Encounter (Signed)
Reviewed GXT instructions with patient

## 2017-10-11 ENCOUNTER — Ambulatory Visit (INDEPENDENT_AMBULATORY_CARE_PROVIDER_SITE_OTHER): Payer: Medicare HMO

## 2017-10-11 DIAGNOSIS — R0602 Shortness of breath: Secondary | ICD-10-CM | POA: Diagnosis not present

## 2017-10-12 LAB — EXERCISE TOLERANCE TEST
Estimated workload: 10.9 METS
Exercise duration (min): 9 min
Exercise duration (sec): 32 s
MPHR: 163 {beats}/min
Peak HR: 144 {beats}/min
Percent HR: 88 %
RPE: 19
Rest HR: 67 {beats}/min

## 2017-10-19 DIAGNOSIS — M19072 Primary osteoarthritis, left ankle and foot: Secondary | ICD-10-CM | POA: Diagnosis not present

## 2017-10-23 ENCOUNTER — Ambulatory Visit
Admission: RE | Admit: 2017-10-23 | Discharge: 2017-10-23 | Disposition: A | Discharge status: Home / Self Care | Payer: Medicare HMO | Source: Ambulatory Visit | Attending: Family Medicine | Admitting: Family Medicine

## 2017-10-23 DIAGNOSIS — R748 Abnormal levels of other serum enzymes: Secondary | ICD-10-CM | POA: Insufficient documentation

## 2017-10-23 DIAGNOSIS — K828 Other specified diseases of gallbladder: Secondary | ICD-10-CM | POA: Diagnosis not present

## 2017-10-24 DIAGNOSIS — M19072 Primary osteoarthritis, left ankle and foot: Secondary | ICD-10-CM | POA: Diagnosis not present

## 2017-11-01 ENCOUNTER — Telehealth: Payer: Self-pay | Admitting: Family Medicine

## 2017-11-01 ENCOUNTER — Encounter: Payer: Self-pay | Admitting: Family Medicine

## 2017-11-01 DIAGNOSIS — E785 Hyperlipidemia, unspecified: Secondary | ICD-10-CM

## 2017-11-01 DIAGNOSIS — K760 Fatty (change of) liver, not elsewhere classified: Secondary | ICD-10-CM | POA: Insufficient documentation

## 2017-11-01 DIAGNOSIS — R748 Abnormal levels of other serum enzymes: Secondary | ICD-10-CM

## 2017-11-01 HISTORY — DX: Fatty (change of) liver, not elsewhere classified: K76.0

## 2017-11-01 NOTE — Telephone Encounter (Signed)
Please see last lab result note (labs drawn 08/31/17, note dated 09/01/17):" let's recheck a hepatic function panel in 6 weeks (please order, dx elev alk phos); his cholesterol has gone up so I'd like to increase his atorvastatin from 10 mg to 20 mg; I'll send new Rx; recheck lipids in 6 weeks (please order, dx hyperlipidemia);" ------------------------------------------- Please ask patient to come in this week or early next week to get these labs rechecked Thank you

## 2017-11-02 NOTE — Telephone Encounter (Signed)
Pt.notified

## 2018-01-01 ENCOUNTER — Ambulatory Visit (INDEPENDENT_AMBULATORY_CARE_PROVIDER_SITE_OTHER): Payer: Medicare HMO | Admitting: Family Medicine

## 2018-01-01 ENCOUNTER — Encounter: Payer: Self-pay | Admitting: Family Medicine

## 2018-01-01 DIAGNOSIS — E782 Mixed hyperlipidemia: Secondary | ICD-10-CM | POA: Diagnosis not present

## 2018-01-01 DIAGNOSIS — M0230 Reiter's disease, unspecified site: Secondary | ICD-10-CM | POA: Insufficient documentation

## 2018-01-01 DIAGNOSIS — K760 Fatty (change of) liver, not elsewhere classified: Secondary | ICD-10-CM

## 2018-01-01 DIAGNOSIS — I10 Essential (primary) hypertension: Secondary | ICD-10-CM | POA: Diagnosis not present

## 2018-01-01 DIAGNOSIS — M0239 Reiter's disease, multiple sites: Secondary | ICD-10-CM | POA: Diagnosis not present

## 2018-01-01 DIAGNOSIS — R748 Abnormal levels of other serum enzymes: Secondary | ICD-10-CM | POA: Diagnosis not present

## 2018-01-01 DIAGNOSIS — R739 Hyperglycemia, unspecified: Secondary | ICD-10-CM | POA: Diagnosis not present

## 2018-01-01 HISTORY — DX: Reiter's disease, unspecified site: M02.30

## 2018-01-01 MED ORDER — AMLODIPINE BESYLATE 10 MG PO TABS: 10.0000 mg | ORAL_TABLET | Freq: Every day | ORAL | 3 refills | Status: DC

## 2018-01-01 MED ORDER — HYDROCHLOROTHIAZIDE 12.5 MG PO TABS: 12.5000 mg | ORAL_TABLET | Freq: Every day | ORAL | 3 refills | Status: DC

## 2018-01-01 MED ORDER — ATENOLOL 50 MG PO TABS: 50.0000 mg | ORAL_TABLET | Freq: Every day | ORAL | 3 refills | Status: DC

## 2018-01-01 MED ORDER — SPIRONOLACTONE 25 MG PO TABS: 25.0000 mg | ORAL_TABLET | Freq: Two times a day (BID) | ORAL | 3 refills | Status: DC

## 2018-01-01 NOTE — Assessment & Plan Note (Signed)
Check lipids today; patient not motivated to change eating habits

## 2018-01-01 NOTE — Assessment & Plan Note (Signed)
Modest weight loss will help

## 2018-01-01 NOTE — Assessment & Plan Note (Signed)
Managed by rheum at [facility] 

## 2018-01-01 NOTE — Assessment & Plan Note (Signed)
Refilled medicines; limit salt

## 2018-01-01 NOTE — Assessment & Plan Note (Signed)
Check sugars today; not fasting but last food 5 hours ago

## 2018-01-01 NOTE — Patient Instructions (Addendum)
Try to limit sweet tea Try to lose five more pounds Try to follow the DASH guidelines (DASH stands for Dietary Approaches to Stop Hypertension). Try to limit the sodium in your diet to no more than 1,500mg  of sodium per day. Certainly try to not exceed 2,000 mg per day at the very most. Do not add salt when cooking or at the table.  Check the sodium amount on labels when shopping, and choose items lower in sodium when given a choice. Avoid or limit foods that already contain a lot of sodium. Eat a diet rich in fruits and vegetables and whole grains, and try to lose weight if overweight or obese If you have not heard anything from my staff in a week about any orders/referrals/studies from today, please contact us here to follow-up [phone]
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
## 2018-01-04 ENCOUNTER — Telehealth: Payer: Self-pay

## 2018-01-04 DIAGNOSIS — R748 Abnormal levels of other serum enzymes: Secondary | ICD-10-CM

## 2018-01-04 NOTE — Telephone Encounter (Signed)
-----  Message from Arnetha Courser, MD sent at 01/03/2018  1:30 PM EDT ----- Roselyn Reef, please ADD ON alkaline phosphatase isoenzymes, dx: elevated alk phos; let pt know glucose and kidney function fine; two liver enzymes up just a bit, so if he drinks any alcohol, we recommend he cut consumption in half; cholesterol much better; thank you

## 2018-01-09 LAB — LIPID PANEL
Cholesterol: 152 mg/dL (ref <200)
HDL: 56 mg/dL (ref >40)
LDL Cholesterol (Calc): 75 mg/dL (calc)
NON-HDL CHOLESTEROL (CALC): 96 mg/dL (ref <130)
Total CHOL/HDL Ratio: 2.7 (calc) (ref <5.0)
Triglycerides: 126 mg/dL (ref <150)

## 2018-01-09 LAB — TEST AUTHORIZATION 2

## 2018-01-09 LAB — COMPLETE METABOLIC PANEL WITH GFR
AG RATIO: 1.7 (calc) (ref 1.0–2.5)
ALT: 50 U/L — ABNORMAL HIGH (ref 9–46)
AST: 25 U/L (ref 10–35)
Albumin: 4.5 g/dL (ref 3.6–5.1)
Alkaline phosphatase (APISO): 138 U/L — ABNORMAL HIGH (ref 40–115)
BUN: 12 mg/dL (ref 7–25)
CALCIUM: 9.1 mg/dL (ref 8.6–10.3)
CHLORIDE: 103 mmol/L (ref 98–110)
CO2: 28 mmol/L (ref 20–32)
Creat: 0.82 mg/dL (ref 0.70–1.33)
GFR, EST AFRICAN AMERICAN: 114 mL/min/{1.73_m2} (ref >=60)
GFR, EST NON AFRICAN AMERICAN: 98 mL/min/{1.73_m2} (ref >=60)
GLOBULIN: 2.6 g/dL (ref 1.9–3.7)
Glucose, Bld: 93 mg/dL (ref 65–139)
POTASSIUM: 4.1 mmol/L (ref 3.5–5.3)
SODIUM: 140 mmol/L (ref 135–146)
Total Bilirubin: 0.4 mg/dL (ref 0.2–1.2)
Total Protein: 7.1 g/dL (ref 6.1–8.1)

## 2018-01-09 LAB — ALKALINE PHOSPHATASE ISOENZYMES
Alkaline phosphatase (APISO): 137 U/L — ABNORMAL HIGH (ref 40–115)
BONE ISOENZYMES (ALP ISO): 6 % — AB (ref 28–66)
INTESTINAL ISOENZYMES (ALP ISO): 4 % (ref 1–24)
Liver Isoenzymes: 81 % — ABNORMAL HIGH (ref 25–69)
MACROHEPATIC ISOENZYMES: 9 % — AB

## 2018-01-13 ENCOUNTER — Other Ambulatory Visit: Payer: Self-pay | Admitting: Family Medicine

## 2018-01-13 DIAGNOSIS — R748 Abnormal levels of other serum enzymes: Secondary | ICD-10-CM

## 2018-01-13 NOTE — Progress Notes (Signed)
Refer to GI, RUQ [place]

## 2018-01-16 ENCOUNTER — Telehealth: Payer: Self-pay

## 2018-01-16 NOTE — Telephone Encounter (Signed)
Copied from [place] [id number]. Topic: Referral - Question >> Jan 16, 2018  1:54 PM Nils Flack, [name] wrote: Reason for CRM: I called to give the pt his information for his ultrasound appt. He is asking why he needs to have another ultrasound done? Please call pt back to let him know. Thanks

## 2018-01-16 NOTE — Telephone Encounter (Signed)
He does not need to get another ultrasound done right now if he can get in to see the gastroenterologist soon Please check on acuity of appointment, thank you

## 2018-01-17 NOTE — Telephone Encounter (Signed)
Pt.notified

## 2018-01-23 ENCOUNTER — Ambulatory Visit: Payer: Medicare HMO

## 2018-02-05 ENCOUNTER — Ambulatory Visit: Payer: Medicare HMO | Admitting: Family Medicine

## 2018-02-07 ENCOUNTER — Ambulatory Visit: Payer: Medicare HMO | Admitting: Family Medicine

## 2018-02-08 ENCOUNTER — Ambulatory Visit: Payer: Medicare HMO | Admitting: Internal Medicine

## 2018-02-21 ENCOUNTER — Encounter: Payer: Self-pay | Admitting: Family Medicine

## 2018-02-21 ENCOUNTER — Ambulatory Visit (INDEPENDENT_AMBULATORY_CARE_PROVIDER_SITE_OTHER): Payer: Medicare HMO | Admitting: Family Medicine

## 2018-02-21 VITALS — BP 118/70 | HR 68 | Temp 98.5°F | Resp 18 | Ht 72.0 in | Wt 206.0 lb

## 2018-02-21 DIAGNOSIS — L02411 Cutaneous abscess of right axilla: Secondary | ICD-10-CM

## 2018-02-21 MED ORDER — MELOXICAM 15 MG PO TABS: 15.0000 mg | ORAL_TABLET | Freq: Every day | ORAL | 0 refills | Status: DC

## 2018-02-21 MED ORDER — SULFAMETHOXAZOLE-TRIMETHOPRIM 800-160 MG PO TABS: 1.0000 | ORAL_TABLET | Freq: Two times a day (BID) | ORAL | 0 refills | Status: DC

## 2018-02-21 NOTE — Progress Notes (Signed)
Name: Franklin Richardson   MRN: [id number]    DOB: 05/25/1961   Date:02/21/2018       Progress Note  Subjective  Chief Complaint  Chief Complaint  Patient presents with  . Mass    bumps under right arm pit for 2 weeks. (Patient stated he popped ) very sore    HPI  Pt presents with concern for multiple "boils" to the right axilla and one to the right forearm.  He notes the first of these appeared a few weeks ago, were located in his upper back just behind his right axilla; he also has an area to the right forearm that he has been "picking at".  There are two areas in the axilla that are extremely tender and appeared over the last week or so.  He denies fevers/chills.    Patient Active Problem List   Diagnosis Date Noted  . 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 Fatty liver 11/01/2017  . Alkaline phosphatase elevation 09/08/2017  . Flu vaccine need 08/31/2017  . TBI (traumatic brain injury) ([place]) 08/31/2017  . 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 Snores 08/31/2017  . Annual physical exam 06/23/2016  . Hyperglycemia 11/04/2015  . Dysphagia 10/08/2015  . Adiposity 04/07/2015  . Allergic rhinitis, seasonal 04/07/2015  . Hypertension 02/05/2015  . Hyperlipidemia 02/05/2015  . 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 Smoking status: Never Smoker  . Smokeless tobacco: Never Used  Substance Use Topics  . Alcohol use: Not Currently     Current Outpatient Medications:  .  amLODipine (NORVASC) 10 MG tablet, Take 1 tablet (10 mg total) by mouth daily., Disp: 90 tablet, Rfl: 3 .  atenolol (TENORMIN) 50 MG tablet, Take 1 tablet (50 mg total) by mouth daily., Disp: 90 tablet, Rfl: 3 .  atorvastatin (LIPITOR) 20 MG tablet, Take 1 tablet (20 mg total) by mouth at bedtime., Disp: 90 tablet, Rfl: 0 .  hydrochlorothiazide (HYDRODIURIL) 12.5 MG tablet, Take 1 tablet (12.5 mg total) by mouth daily., Disp: 90 tablet, Rfl: 3 .  spironolactone (ALDACTONE)  25 MG tablet, Take 1 tablet (25 mg total) by mouth 2 (two) times daily., Disp: 180 tablet, Rfl: 3 .  Adalimumab 40 MG/0.8ML PNKT, Inject 40 mg into the skin every 21 ( twenty-one) days. , Disp: , Rfl:   Allergies  Allergen Reactions  . Ace Inhibitors Shortness Of Breath and Swelling  . Quinapril Swelling    Other reaction(s): SWELLING/EDEMA    ROS  Constitutional: Negative for fever or weight change.  Respiratory: Negative for cough and shortness of breath.   Cardiovascular: Negative for chest pain or palpitations.  Gastrointestinal: Negative for abdominal pain, no bowel changes.  Musculoskeletal: Negative for gait problem or joint swelling.  Skin: Negative for rash. See HPI Neurological: Negative for dizziness or headache.  No other specific complaints in a complete review of systems (except as listed in HPI above).  Objective  Vitals:   02/21/18 0746  BP: 118/70  Pulse: 68  Resp: 18  Temp: 98.5 F (36.9 C)  TempSrc: Oral  SpO2: 99%  Weight: 206 lb (93.4 kg)  Height: 6' (1.829 m)   Body mass index is 27.94 kg/m.  Nursing Note and Vital Signs reviewed.  Physical Exam  Constitutional: He is oriented to person, place, and time. He appears well-developed and well-nourished.  HENT:  Head: Normocephalic and atraumatic.  Right Ear: External ear normal.  Left Ear: External ear normal.  Nose: Nose  normal.  Mouth/Throat: Mucous membranes are normal. No tonsillar exudate.  Eyes: Conjunctivae and EOM are normal. No scleral icterus.  Neck: Normal range of motion. Neck supple.  Cardiovascular: Normal rate, regular rhythm and normal heart sounds.  Pulmonary/Chest: Effort normal and breath sounds normal.  Musculoskeletal: Normal range of motion. He exhibits no edema.  Neurological: He is alert and oriented to person, place, and time. No cranial nerve deficit.  Skin: Skin is warm and dry. No rash noted.  - RIGHT forearm has erythematous lesion about quarter-sized, that is  non-tender and has a central area that has dried sanguinous exudate, no fluctuance. - RIGHT upper back has two raised lesions that are approximately nickle-sized that are mildly tender with dried sanguinous exudate in the center. - RIGHT axilla exhibits two discreet abscesses: #1 Proximal medial abscess with moderate amount of fluctuance, is exquisitely tender to palpation, white raised center with underlying erythema. #2 Distal lateral abscess with no fluctuance - firm and tender to palpation with white raised center and underlying erythema.  Psychiatric: He has a normal mood and affect. His behavior is normal. Judgment and thought content normal.  Nursing note and vitals reviewed.  No results found for this or any previous visit (from the past 72 hour(s)).  Assessment & Plan  1. Cutaneous abscess of right axilla - sulfamethoxazole-trimethoprim (BACTRIM DS,SEPTRA DS) 800-160 MG tablet; Take 1 tablet by mouth 2 (two) times daily for 7 days.  Dispense: 14 tablet; Refill: 0 - meloxicam (MOBIC) 15 MG tablet; Take 1 tablet (15 mg total) by mouth daily.  Dispense: 10 tablet; Refill: 0 - Wound culture - Advised to apply moist heat to the axilla several times a day, complete ABX. - I&D as below.  I&D:  Consent signed: YES Procedure: Incision and drainage of proximal medial RIGHT axillary abscess Location: RIGHT Axilla Equipment used: sterile scalpel, tweezers Anesthesia:  1% Lidocaine w/o Epinephrine  Cleaned and prepped: Betadine and Alcohol After consent signed, affected area of skin prepped with betadine. Lidocaine w/o epinephrine injected into surround areas and area of intended incisions directly over greatest area of fluctuance of abscess. After properly numbed, 1cm incision with #10 blade made over abscess. Purulent material expressed, abscess cavity and loculations debrided.  Packed cavity with iodoform sterile packing strip - approx 3-4cm. Covered area with sterile guaze and taped.  Instructed on dressing changes, materials given, pain medications discussed. F/U for wound check in 2 days.  -Red flags and when to present for emergency care or RTC including fever >101.104F, chest pain, shortness of breath, new/worsening/un-resolving symptoms, red streaking of the skin, increased swelling, reviewed with patient at time of visit. Follow up and care instructions discussed and provided in AVS.

## 2018-02-23 ENCOUNTER — Encounter: Payer: Self-pay | Admitting: Family Medicine

## 2018-02-23 ENCOUNTER — Ambulatory Visit (INDEPENDENT_AMBULATORY_CARE_PROVIDER_SITE_OTHER): Payer: Medicare HMO | Admitting: Family Medicine

## 2018-02-23 ENCOUNTER — Telehealth: Payer: Self-pay | Admitting: Family Medicine

## 2018-02-23 VITALS — BP 126/74 | HR 64 | Temp 98.4°F | Resp 16 | Ht 72.0 in | Wt 201.4 lb

## 2018-02-23 DIAGNOSIS — L0291 Cutaneous abscess, unspecified: Secondary | ICD-10-CM

## 2018-02-23 DIAGNOSIS — L02411 Cutaneous abscess of right axilla: Secondary | ICD-10-CM

## 2018-02-23 DIAGNOSIS — Z79899 Other long term (current) drug therapy: Secondary | ICD-10-CM

## 2018-02-23 DIAGNOSIS — D84821 Immunodeficiency due to drugs: Secondary | ICD-10-CM

## 2018-02-23 MED ORDER — CLINDAMYCIN HCL 300 MG PO CAPS
300.0000 mg | ORAL_CAPSULE | Freq: Two times a day (BID) | ORAL | 0 refills | Status: AC
Start: 2018-02-23 — End: 2018-03-02

## 2018-02-23 NOTE — Telephone Encounter (Signed)
Please call Dr. Berenda Morale [facility]) - he is taking humara and skipped his dose yesterday. He is being treated for skin infection and is now on clindamycin.  Does she want him to skip this week's dose or should he proceed as usual.

## 2018-02-23 NOTE — Telephone Encounter (Signed)
patient notified

## 2018-02-23 NOTE — Telephone Encounter (Signed)
Received message back from Dr. Fredonia Highland - she advises Franklin Richardson HOLDS his Humira until all of the abscesses have healed.   I placed a referral to dermatology - I really want him to be seen next week if at all possible.  If unable to secure an appointment in the next week, he needs to come back in to our office for evaluation next week.

## 2018-02-23 NOTE — Progress Notes (Signed)
Name: Franklin Richardson   MRN: [id number]    DOB: June 11, 1961   Date:02/23/2018       Progress Note  Subjective  Chief Complaint  Chief Complaint  Patient presents with  . Follow-up    2 day follow up, several new spots has came up    HPI  Pt presents for wound check - right axilla had I&D 2 days ago, packing was removed by the patient yesterday by accident while bathing.  He reports pain has improved to 4/10. He does note two new areas - on to the right forearm and one to the left elbow. He denies fevers, chills, body aches, or fatigue.    - He takes humira for history of spondyloarthritis and Reiter's disease - sees Dr. Berenda Morale with [facility] for management.  He skipped his dose yesterday, and we will call her office today to notify them of anitbiotic use/changes and inquire if he should skip this week's dose completely or if he should take it.  Patient Active Problem List   Diagnosis Date Noted  . 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 Fatty liver 11/01/2017  . Alkaline phosphatase elevation 09/08/2017  . Flu vaccine need 08/31/2017  . TBI (traumatic brain injury) ([place]) 08/31/2017  . 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 Snores 08/31/2017  . Annual physical exam 06/23/2016  . Hyperglycemia 11/04/2015  . Dysphagia 10/08/2015  . Adiposity 04/07/2015  . Allergic rhinitis, seasonal 04/07/2015  . Hypertension 02/05/2015  . Hyperlipidemia 02/05/2015  . 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 Smoking status: Never Smoker  . Smokeless tobacco: Never Used  Substance Use Topics  . Alcohol use: Not Currently     Current Outpatient Medications:  .  Adalimumab 40 MG/0.8ML PNKT, Inject 40 mg into the skin every 21 ( twenty-one) days. , Disp: , Rfl:  .  amLODipine (NORVASC) 10 MG tablet, Take 1 tablet (10 mg total) by mouth daily., Disp: 90 tablet, Rfl: 3 .  atenolol (TENORMIN) 50 MG tablet, Take 1 tablet (50 mg total) by mouth  daily., Disp: 90 tablet, Rfl: 3 .  atorvastatin (LIPITOR) 20 MG tablet, Take 1 tablet (20 mg total) by mouth at bedtime., Disp: 90 tablet, Rfl: 0 .  hydrochlorothiazide (HYDRODIURIL) 12.5 MG tablet, Take 1 tablet (12.5 mg total) by mouth daily., Disp: 90 tablet, Rfl: 3 .  meloxicam (MOBIC) 15 MG tablet, Take 1 tablet (15 mg total) by mouth daily., Disp: 10 tablet, Rfl: 0 .  spironolactone (ALDACTONE) 25 MG tablet, Take 1 tablet (25 mg total) by mouth 2 (two) times daily., Disp: 180 tablet, Rfl: 3 .  sulfamethoxazole-trimethoprim (BACTRIM DS,SEPTRA DS) 800-160 MG tablet, Take 1 tablet by mouth 2 (two) times daily for 7 days., Disp: 14 tablet, Rfl: 0  Allergies  Allergen Reactions  . Ace Inhibitors Shortness Of Breath and Swelling  . Quinapril Swelling    Other reaction(s): SWELLING/EDEMA    ROS  Constitutional: Negative for fever or weight change.  Respiratory: Negative for cough and shortness of breath.   Cardiovascular: Negative for chest pain or palpitations.  Gastrointestinal: Negative for abdominal pain, no bowel changes.  Musculoskeletal: Negative for gait problem or joint swelling.  Skin: Negative for rash. See HPI Neurological: Negative for dizziness or headache.  No other specific complaints in a complete review of systems (except as listed in HPI above).  Objective  Vitals:   02/23/18 0748  BP: 126/74  Pulse: 64  Resp: 16  Temp: 98.4 F (36.9 C)  TempSrc: Oral  SpO2: 99%  Weight: 201 lb 6.4 oz (91.4 kg)  Height: 6' (1.829 m)   Body mass index is 27.31 kg/m.  Nursing Note and Vital Signs reviewed.  Physical Exam  Constitutional: Patient appears well-developed and well-nourished. No distress.  HENT: Head: Normocephalic and atraumatic.  Neck: Normal range of motion. Neck supple. No JVD present. No thyromegaly present.  Cardiovascular: Normal rate, regular rhythm and normal heart sounds.  No murmur heard. No BLE edema. Pulmonary/Chest: Effort normal and breath  sounds normal. No respiratory distress. Musculoskeletal: Normal range of motion, no joint effusions. No gross deformities Neurological: he is alert and oriented to person, place, and time. No cranial nerve deficit. Coordination, balance, strength, speech and gait are normal.  Skin: Skin is warm and dry. No rash noted. No erythema.  - RIGHT forearm: #1 discrete lesion that is healing - no longer erythematous, non-tender, no fluctuace #2 erythematous lesion about quarter-sized, that is non-tender and has a cetral white area; firm to palpation without fluctuance. - RIGHT upper back has two raised lesions that are approximately nickle-sized that are non-tender with dried sanguinous exudate in the center - appear stable from last check - RIGHT axilla exhibits two discreet abscesses: #1 Proximal medial abscess with no fluctuance, is minimally tender to palpation with underlying erythema that appears improved from last visit - packing and bandaging are not present. #2 Distal lateral abscess with no fluctuance - firm and tender to palpation with white raised center and underlying erythema.  - Left UE has small cutaneous abscess just distal to the olecranon process. Area is slightly tender to palpation without fluctuance; no bony tenderness, no joint effusion, full AROM. Psychiatric: Patient has a normal mood and affect. behavior is normal. Judgment and thought content normal.   Results for orders placed or performed in visit on 02/21/18 (from the past 72 hour(s))  Wound culture     Status: None (Preliminary result)   Collection Time: 02/21/18  8:27 AM  Result Value Ref Range   MICRO NUMBER: [id number]    SPECIMEN QUALITY: ADEQUATE    SOURCE: WOUND (SITE NOT SPECIFIED)    STATUS: PRELIMINARY    GRAM STAIN:      Rare Polymorphonuclear leukocytes No epithelial cells seen Rare Gram positive cocci in clusters    Assessment & Plan  1. Abscess of multiple sites - clindamycin (CLEOCIN) 300 MG capsule;  Take 1 capsule (300 mg total) by mouth 2 (two) times daily for 7 days.  Dispense: 14 capsule; Refill: 0 - Ambulatory referral to Dermatology - Advised to STOP Bactrim. Start clindamycin, and start taking a probiotic to help prevent Cdiff colitis - discussed risk of secondary gut infection and pt verbalizes return precautions.  2. Cutaneous abscess of right axilla - Ambulatory referral to Dermatology  3. Immunocompromised state due to drug therapy - Ambulatory referral to Dermatology - Message is sent to Dr. Fredonia Highland, [facility] rheumat[facility].  Pt instructed to hold humira dosing until he is able to get in touch with Dr. Fredonia Highland. Pt is also instructed to call Dr. Fredonia Highland' office for instructions.  -Red flags and when to present for emergency care or RTC including fever >101.17F, chest pain, shortness of breath, new/worsening/un-resolving symptoms, fatigue, tachycardia, reviewed with patient at time of visit. Follow up and care instructions discussed and provided in AVS.

## 2018-02-23 NOTE — Patient Instructions (Addendum)
Apply hot compresses to eat boil that is present as often as tolerated.  Take a probiotic with your new antibiotic  STOP taking Bactrim (old antibiotic).  Start taking clindamycin

## 2018-02-23 NOTE — Telephone Encounter (Signed)
Patient notified

## 2018-02-23 NOTE — Telephone Encounter (Signed)
Community message sent to Dr. Fredonia Highland. Please call patient and let him know we reached out to her.  He should hold his humira until he hears from Dr. Fredonia Highland.  I do recommend he call her office directly as well to notify them of his infection and antibiotic use.

## 2018-02-23 NOTE — Telephone Encounter (Signed)
Spoke with Levada Dy at [place] and left message regarding [name]. Dr. Fredonia Highland is out of the office and will be back on Tuesday 9/3. She will be checking her message so back line and cell number was given

## 2018-02-24 LAB — WOUND CULTURE
MICRO NUMBER: 91033355
SPECIMEN QUALITY:: ADEQUATE

## 2018-02-27 ENCOUNTER — Other Ambulatory Visit: Payer: Self-pay | Admitting: Family Medicine

## 2018-02-27 DIAGNOSIS — Z22322 Carrier or suspected carrier of Methicillin resistant Staphylococcus aureus: Secondary | ICD-10-CM

## 2018-02-27 MED ORDER — MUPIROCIN CALCIUM 2 % NA OINT: 1.0000 "application " | TOPICAL_OINTMENT | Freq: Two times a day (BID) | NASAL | 0 refills | Status: DC

## 2018-03-01 NOTE — Telephone Encounter (Signed)
Does he have Derm appointment this week? If not, please have him come in to our office for evaluation today or tomorrow.

## 2018-03-01 NOTE — Telephone Encounter (Signed)
Called both numbers left patient message to make appointment and to check on date of dermatology appointment

## 2018-03-06 ENCOUNTER — Telehealth: Payer: Self-pay | Admitting: Family Medicine

## 2018-03-06 NOTE — Telephone Encounter (Signed)
Please call and check in on Franklin Richardson - Has he seen dermatology? Has he set up a follow up with Dr. Fredonia Highland?

## 2018-03-06 NOTE — Telephone Encounter (Signed)
Appointment with Dermatology is not until October. Appointment with Dr. Fredonia Highland til end of September. Wounds look a lot better.

## 2018-03-07 ENCOUNTER — Encounter: Payer: Self-pay | Admitting: Gastroenterology

## 2018-03-07 ENCOUNTER — Ambulatory Visit: Payer: Medicare HMO | Admitting: Gastroenterology

## 2018-03-07 ENCOUNTER — Other Ambulatory Visit
Admission: RE | Admit: 2018-03-07 | Discharge: 2018-03-07 | Disposition: A | Discharge status: Home / Self Care | Payer: Medicare HMO | Source: Ambulatory Visit | Attending: Gastroenterology | Admitting: Gastroenterology

## 2018-03-07 VITALS — BP 133/71 | HR 57 | Ht 72.0 in | Wt 210.0 lb

## 2018-03-07 DIAGNOSIS — R748 Abnormal levels of other serum enzymes: Secondary | ICD-10-CM

## 2018-03-07 LAB — IRON AND TIBC
IRON: 68 ug/dL (ref 45–182)
Saturation Ratios: 21 % (ref 17.9–39.5)
TIBC: 320 ug/dL (ref 250–450)
UIBC: 252 ug/dL

## 2018-03-07 LAB — HEPATIC FUNCTION PANEL
ALT: 40 U/L (ref 0–44)
AST: 24 U/L (ref 15–41)
Albumin: 4.3 g/dL (ref 3.5–5.0)
Alkaline Phosphatase: 120 U/L (ref 38–126)
Bilirubin, Direct: <0.1 mg/dL (ref 0.0–0.2)
TOTAL PROTEIN: 7.7 g/dL (ref 6.5–8.1)
Total Bilirubin: 0.7 mg/dL (ref 0.3–1.2)

## 2018-03-07 LAB — FERRITIN: FERRITIN: 82 ng/mL (ref 24–336)

## 2018-03-07 LAB — PROTIME-INR
INR: 0.95
Prothrombin Time: 12.6 seconds (ref 11.4–15.2)

## 2018-03-07 NOTE — Telephone Encounter (Signed)
He needs to come back in to our office for wound check this week some time, and remain off of Humira until all wounds are completely healed.
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
## 2018-03-07 NOTE — Telephone Encounter (Signed)
Patient notified

## 2018-03-08 LAB — CERULOPLASMIN: CERULOPLASMIN: 22.3 mg/dL (ref 16.0–31.0)

## 2018-03-08 LAB — ANTI-SMOOTH MUSCLE ANTIBODY, IGG: F-Actin IgG: 5 Units (ref 0–19)

## 2018-03-08 LAB — HEPATITIS B SURFACE ANTIBODY,QUALITATIVE: Hep B S Ab: NONREACTIVE

## 2018-03-08 LAB — ANA: Anti Nuclear Antibody(ANA): NEGATIVE

## 2018-03-08 LAB — HEPATITIS A ANTIBODY, TOTAL: HEP A TOTAL AB: NEGATIVE

## 2018-03-08 LAB — HEPATITIS B SURFACE ANTIGEN: Hepatitis B Surface Ag: NEGATIVE

## 2018-03-08 LAB — ALPHA-1-ANTITRYPSIN: A-1 Antitrypsin, Ser: 137 mg/dL (ref 90–200)

## 2018-03-09 LAB — MITOCHONDRIAL ANTIBODIES: Mitochondrial M2 Ab, IgG: <20 Units (ref 0.0–20.0)

## 2018-03-12 LAB — HEPATITIS C GENOTYPE

## 2018-03-13 ENCOUNTER — Telehealth: Payer: Self-pay

## 2018-03-13 NOTE — Telephone Encounter (Signed)
Pt notified of results

## 2018-03-13 NOTE — Telephone Encounter (Signed)
-----   Message from Lucilla Lame, MD sent at 03/12/2018  2:35 PM EDT ----- Let the patient know that his liver enzymes have returned to normal.  He is not immune to hepatitis A or hepatitis B and should consider having vaccinations.

## 2018-03-15 ENCOUNTER — Ambulatory Visit: Payer: Medicare HMO

## 2018-04-17 DIAGNOSIS — M4328 Fusion of spine, sacral and sacrococcygeal region: Secondary | ICD-10-CM | POA: Diagnosis not present

## 2018-04-17 DIAGNOSIS — M109 Gout, unspecified: Secondary | ICD-10-CM | POA: Diagnosis not present

## 2018-04-17 DIAGNOSIS — M4690 Unspecified inflammatory spondylopathy, site unspecified: Secondary | ICD-10-CM | POA: Diagnosis not present

## 2018-04-17 DIAGNOSIS — Z79899 Other long term (current) drug therapy: Secondary | ICD-10-CM | POA: Diagnosis not present

## 2018-04-17 DIAGNOSIS — M0608 Rheumatoid arthritis without rheumatoid factor, vertebrae: Secondary | ICD-10-CM | POA: Diagnosis not present

## 2018-04-17 DIAGNOSIS — Z23 Encounter for immunization: Secondary | ICD-10-CM | POA: Diagnosis not present

## 2018-04-17 DIAGNOSIS — I10 Essential (primary) hypertension: Secondary | ICD-10-CM | POA: Diagnosis not present

## 2018-04-25 ENCOUNTER — Other Ambulatory Visit: Payer: Self-pay | Admitting: Family Medicine

## 2018-06-04 ENCOUNTER — Encounter: Payer: Self-pay | Admitting: Emergency Medicine

## 2018-06-04 ENCOUNTER — Emergency Department
Admission: EM | Admit: 2018-06-04 | Discharge: 2018-06-04 | Disposition: A | Discharge status: Home / Self Care | Payer: Medicare HMO | Attending: Emergency Medicine | Admitting: Emergency Medicine

## 2018-06-04 ENCOUNTER — Other Ambulatory Visit: Payer: Self-pay

## 2018-06-04 ENCOUNTER — Emergency Department: Payer: Medicare HMO

## 2018-06-04 ENCOUNTER — Ambulatory Visit (INDEPENDENT_AMBULATORY_CARE_PROVIDER_SITE_OTHER): Payer: Medicare HMO | Admitting: Family Medicine

## 2018-06-04 ENCOUNTER — Encounter: Payer: Self-pay | Admitting: Family Medicine

## 2018-06-04 VITALS — BP 124/72 | HR 97 | Temp 98.1°F | Resp 18 | Ht 72.0 in | Wt 198.9 lb

## 2018-06-04 DIAGNOSIS — R112 Nausea with vomiting, unspecified: Secondary | ICD-10-CM | POA: Diagnosis not present

## 2018-06-04 DIAGNOSIS — Z79899 Other long term (current) drug therapy: Secondary | ICD-10-CM | POA: Insufficient documentation

## 2018-06-04 DIAGNOSIS — R197 Diarrhea, unspecified: Secondary | ICD-10-CM | POA: Diagnosis not present

## 2018-06-04 DIAGNOSIS — I10 Essential (primary) hypertension: Secondary | ICD-10-CM | POA: Insufficient documentation

## 2018-06-04 DIAGNOSIS — J189 Pneumonia, unspecified organism: Secondary | ICD-10-CM | POA: Diagnosis not present

## 2018-06-04 DIAGNOSIS — J181 Lobar pneumonia, unspecified organism: Secondary | ICD-10-CM

## 2018-06-04 DIAGNOSIS — R05 Cough: Secondary | ICD-10-CM | POA: Diagnosis not present

## 2018-06-04 LAB — COMPREHENSIVE METABOLIC PANEL
ALBUMIN: 4.1 g/dL (ref 3.5–5.0)
ALT: 43 U/L (ref 0–44)
ANION GAP: 10 (ref 5–15)
AST: 31 U/L (ref 15–41)
Alkaline Phosphatase: 111 U/L (ref 38–126)
BILIRUBIN TOTAL: 0.8 mg/dL (ref 0.3–1.2)
BUN: 24 mg/dL — ABNORMAL HIGH (ref 6–20)
CO2: 26 mmol/L (ref 22–32)
Calcium: 8.6 mg/dL — ABNORMAL LOW (ref 8.9–10.3)
Chloride: 95 mmol/L — ABNORMAL LOW (ref 98–111)
Creatinine, Ser: 1.18 mg/dL (ref 0.61–1.24)
GFR calc non Af Amer: >60 mL/min (ref >60)
GLUCOSE: 142 mg/dL — AB (ref 70–99)
POTASSIUM: 3.5 mmol/L (ref 3.5–5.1)
Sodium: 131 mmol/L — ABNORMAL LOW (ref 135–145)
TOTAL PROTEIN: 8.2 g/dL — AB (ref 6.5–8.1)

## 2018-06-04 LAB — URINALYSIS, COMPLETE (UACMP) WITH MICROSCOPIC
BILIRUBIN URINE: NEGATIVE
Bacteria, UA: NONE SEEN
GLUCOSE, UA: NEGATIVE mg/dL
KETONES UR: 5 mg/dL — AB
LEUKOCYTES UA: NEGATIVE
NITRITE: NEGATIVE
PH: 5 (ref 5.0–8.0)
Protein, ur: 100 mg/dL — AB
SPECIFIC GRAVITY, URINE: 1.03 (ref 1.005–1.030)

## 2018-06-04 LAB — CBC
HCT: 42.2 % (ref 39.0–52.0)
HEMOGLOBIN: 13.7 g/dL (ref 13.0–17.0)
MCH: 28.1 pg (ref 26.0–34.0)
MCHC: 32.5 g/dL (ref 30.0–36.0)
MCV: 86.7 fL (ref 80.0–100.0)
NRBC: 0 % (ref 0.0–0.2)
PLATELETS: 221 10*3/uL (ref 150–400)
RBC: 4.87 MIL/uL (ref 4.22–5.81)
RDW: 12.9 % (ref 11.5–15.5)
WBC: 10.2 10*3/uL (ref 4.0–10.5)

## 2018-06-04 LAB — LIPASE, BLOOD: LIPASE: 27 U/L (ref 11–51)

## 2018-06-04 MED ORDER — AMOXICILLIN 500 MG PO CAPS: 1000.0000 mg | ORAL_CAPSULE | Freq: Three times a day (TID) | ORAL | 0 refills | Status: AC

## 2018-06-04 MED ORDER — AZITHROMYCIN 250 MG PO TABS: ORAL_TABLET | ORAL | 0 refills | Status: AC

## 2018-06-04 NOTE — Progress Notes (Signed)
Name: Franklin Richardson   MRN: [id number]    DOB: 07/14/1960   Date:06/04/2018       Progress Note  Subjective  Chief Complaint  Chief Complaint  Patient presents with  . URI    cough, congested, fever, bodyaches  . Ear Fullness    unsteady, chills  . Emesis  . Diarrhea    HPI  Pt presents with concern for 5 days of flu-like symptoms.  He reports sudden onset of body aches, fevers and chills, cough, nasal congestion, also endorses vomiting and diarrhea. No chest pain, shortness of breath.  He notes that he has not been able to eat/keep fluids down for several days.  His son accompanies him and describes a few episodes of confusion today, and that on the drive here the patient kept falling asleep.   Patient Active Problem List   Diagnosis Date Noted  . 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 Fatty liver 11/01/2017  . Alkaline phosphatase elevation 09/08/2017  . Flu vaccine need 08/31/2017  . TBI (traumatic brain injury) ([place]) 08/31/2017  . 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 Snores 08/31/2017  . Annual physical exam 06/23/2016  . Hyperglycemia 11/04/2015  . Dysphagia 10/08/2015  . Adiposity 04/07/2015  . Allergic rhinitis, seasonal 04/07/2015  . Hypertension 02/05/2015  . Hyperlipidemia 02/05/2015  . 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 Smoking status: Never Smoker  . Smokeless tobacco: Never Used  . Tobacco comment: smoking cessation materials not required  Substance Use Topics  . Alcohol use: Not Currently     Current Outpatient Medications:  .  Adalimumab 40 MG/0.8ML PNKT, Inject 40 mg into the skin every 21 ( twenty-one) days. , Disp: , Rfl:  .  amLODipine (NORVASC) 10 MG tablet, Take 1 tablet (10 mg total) by mouth daily., Disp: 90 tablet, Rfl: 3 .  atenolol (TENORMIN) 50 MG tablet, Take 1 tablet (50 mg total) by mouth daily., Disp: 90 tablet, Rfl: 3 .  atorvastatin (LIPITOR) 20 MG tablet, TAKE 1 TABLET AT  BEDTIME (NEW DOSE), Disp: 90 tablet, Rfl: 0 .  hydrochlorothiazide (HYDRODIURIL) 12.5 MG tablet, Take 1 tablet (12.5 mg total) by mouth daily., Disp: 90 tablet, Rfl: 3 .  meloxicam (MOBIC) 15 MG tablet, Take 1 tablet (15 mg total) by mouth daily., Disp: 10 tablet, Rfl: 0 .  mupirocin nasal ointment (BACTROBAN NASAL) 2 %, Place 1 application into the nose 2 (two) times daily. Use one-half of tube in each nostril twice daily for five (5) days. After application, press sides of nose together and gently massage., Disp: 10 g, Rfl: 0 .  spironolactone (ALDACTONE) 25 MG tablet, Take 1 tablet (25 mg total) by mouth 2 (two) times daily., Disp: 180 tablet, Rfl: 3  Allergies  Allergen Reactions  . Ace Inhibitors Shortness Of Breath and Swelling  . Quinapril Swelling    Other reaction(s): SWELLING/EDEMA    I personally reviewed active problem list, medication list, allergies with the patient/caregiver today.  ROS  Ten systems reviewed and is negative except as mentioned in HPI  Objective  Vitals:   06/04/18 1339  BP: 124/72  Pulse: 97  Resp: 18  Temp: 98.1 F (36.7 C)  TempSrc: Oral  SpO2: 97%  Weight: 198 lb 14.4 oz (90.2 kg)  Height: 6' (1.829 m)   Body mass index is 26.98 kg/m.  Nursing Note and Vital Signs reviewed.  Physical Exam  Constitutional: Patient appears well-developed and well-nourished. No distress.  HENT: Head: Normocephalic and atraumatic. Ears: bilateral TMs with no erythema or effusion; Nose: Nose normal. Mouth/Throat: Oropharynx is clear.  Mouth is dry, lips are chapped.  No oropharyngeal exudate or tonsillar swelling.  Eyes: Conjunctivae and EOM are normal. No scleral icterus.  Pupils are equal, round, and reactive to light.  Neck: Normal range of motion. Neck supple. No JVD present. No thyromegaly present.  Cardiovascular: Auscultated rate is approximately 110bpm, regular rhythm and normal heart sounds.  No murmur heard. No BLE edema. Pulmonary/Chest: Effort  normal and breath sounds normal. No respiratory distress. Abdominal: Soft. Bowel sounds are normal, no distension. There is generalized tenderness. No masses. No CVA tenderness Musculoskeletal: Normal range of motion, no joint effusions. No gross deformities Neurological: Pt is alert and oriented to person and place. No gross cranial nerve deficit. Coordination, balance, strength, speech and gait are normal. Pt needing questions repeated, however he is also hard of hearing. Skin: Skin is warm and dry. No rash noted. No erythema.  Psychiatric: Patient has a normal mood and affect. behavior is normal. Judgment and thought content normal.  No results found for this or any previous visit (from the past 72 hour(s)).  Assessment & Plan  1. Diarrhea, unspecified type 2. Non-intractable vomiting with nausea, unspecified vomiting type - Due to concerns for dehydration and confusion, along with patient feeling weak and unable to tolerate PO fluids/foods, I recommend ER for further evaluation.  Offered EMS transport, he declines, son will drive him.
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
## 2018-06-04 NOTE — ED Triage Notes (Signed)
Vomiting x 2 days. Denies abdominal pain. Weakness.

## 2018-06-04 NOTE — ED Notes (Signed)
Cough noted in triage, states is new, CXR added

## 2018-06-12 ENCOUNTER — Ambulatory Visit (INDEPENDENT_AMBULATORY_CARE_PROVIDER_SITE_OTHER): Payer: Medicare HMO | Admitting: Nurse Practitioner

## 2018-06-12 ENCOUNTER — Encounter: Payer: Self-pay | Admitting: Nurse Practitioner

## 2018-06-12 VITALS — BP 120/62 | HR 63 | Temp 97.7°F | Resp 14 | Ht 72.0 in | Wt 199.1 lb

## 2018-06-12 DIAGNOSIS — J181 Lobar pneumonia, unspecified organism: Secondary | ICD-10-CM | POA: Diagnosis not present

## 2018-06-12 DIAGNOSIS — J0110 Acute frontal sinusitis, unspecified: Secondary | ICD-10-CM | POA: Diagnosis not present

## 2018-06-12 DIAGNOSIS — R059 Cough, unspecified: Secondary | ICD-10-CM

## 2018-06-12 DIAGNOSIS — R05 Cough: Secondary | ICD-10-CM

## 2018-06-12 DIAGNOSIS — J189 Pneumonia, unspecified organism: Secondary | ICD-10-CM

## 2018-06-12 MED ORDER — BENZONATATE 100 MG PO CAPS: 100.0000 mg | ORAL_CAPSULE | Freq: Two times a day (BID) | ORAL | 0 refills | Status: DC | PRN

## 2018-06-12 MED ORDER — DOXYCYCLINE HYCLATE 100 MG PO TABS: 100.0000 mg | ORAL_TABLET | Freq: Two times a day (BID) | ORAL | 0 refills | Status: DC
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
## 2018-06-12 NOTE — Patient Instructions (Addendum)
-   Please use incentive spirometer at least 5 times a day 10 repetitions to help expand lungs - Take new antibiotic, twice a day for 10 days, complete the entire course even if you feel better before.

## 2018-06-19 ENCOUNTER — Ambulatory Visit: Payer: Medicare HMO | Admitting: Nurse Practitioner

## 2018-07-04 ENCOUNTER — Ambulatory Visit (INDEPENDENT_AMBULATORY_CARE_PROVIDER_SITE_OTHER): Payer: Medicare HMO | Admitting: Family Medicine

## 2018-07-04 ENCOUNTER — Encounter: Payer: Self-pay | Admitting: Family Medicine

## 2018-07-04 DIAGNOSIS — I10 Essential (primary) hypertension: Secondary | ICD-10-CM | POA: Diagnosis not present

## 2018-07-04 DIAGNOSIS — J302 Other seasonal allergic rhinitis: Secondary | ICD-10-CM

## 2018-07-04 DIAGNOSIS — M47819 Spondylosis without myelopathy or radiculopathy, site unspecified: Secondary | ICD-10-CM

## 2018-07-04 DIAGNOSIS — R739 Hyperglycemia, unspecified: Secondary | ICD-10-CM

## 2018-07-04 DIAGNOSIS — E782 Mixed hyperlipidemia: Secondary | ICD-10-CM | POA: Diagnosis not present

## 2018-07-04 DIAGNOSIS — M0608 Rheumatoid arthritis without rheumatoid factor, vertebrae: Secondary | ICD-10-CM

## 2018-07-04 DIAGNOSIS — K760 Fatty (change of) liver, not elsewhere classified: Secondary | ICD-10-CM

## 2018-07-04 DIAGNOSIS — M0239 Reiter's disease, multiple sites: Secondary | ICD-10-CM

## 2018-07-04 NOTE — Patient Instructions (Addendum)
Try to follow the DASH guidelines (DASH stands for Dietary Approaches to Stop Hypertension). Try to limit the sodium in your diet to no more than 1,500mg  of sodium per day. Certainly try to not exceed 2,000 mg per day at the very most. Do not add salt when cooking or at the table.  Check the sodium amount on labels when shopping, and choose items lower in sodium when given a choice. Avoid or limit foods that already contain a lot of sodium. Eat a diet rich in fruits and vegetables and whole grains, and try to lose weight if overweight or obese Try to use PLAIN allergy medicine without the decongestant Avoid: phenylephrine, phenylpropanolamine, and pseudoephredine Try to limit saturated fats in your diet (bologna, hot dogs, barbeque, cheeseburgers, hamburgers, steak, bacon, sausage, cheese, etc.) and get more fresh fruits, vegetables, and whole grains Try to limit egg yolks to no more than 3 per week If you have not heard anything from my staff in a week about any orders/referrals/studies from today, please contact us here to follow-up [phone]

## 2018-07-04 NOTE — Assessment & Plan Note (Signed)
Avoid decongestants with HTN
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
## 2018-07-04 NOTE — Assessment & Plan Note (Signed)
Check lipids; encouraged healthier diet

## 2018-07-04 NOTE — Assessment & Plan Note (Signed)
Check glucose and A1c 

## 2018-07-04 NOTE — Assessment & Plan Note (Signed)
Managed by [facility]

## 2018-07-04 NOTE — Assessment & Plan Note (Signed)
Managed by rheum at [facility]

## 2018-07-04 NOTE — Assessment & Plan Note (Addendum)
Check liver function tests; cut back on fatty intake

## 2018-07-04 NOTE — Assessment & Plan Note (Signed)
Well-controlled today; try to follow DASH guidelines

## 2018-07-05 LAB — COMPLETE METABOLIC PANEL WITH GFR
AG RATIO: 1.6 (calc) (ref 1.0–2.5)
ALT: 22 U/L (ref 9–46)
AST: 13 U/L (ref 10–35)
Albumin: 4.2 g/dL (ref 3.6–5.1)
Alkaline phosphatase (APISO): 96 U/L (ref 40–115)
BILIRUBIN TOTAL: 0.3 mg/dL (ref 0.2–1.2)
BUN: 14 mg/dL (ref 7–25)
CALCIUM: 9.2 mg/dL (ref 8.6–10.3)
CHLORIDE: 103 mmol/L (ref 98–110)
CO2: 28 mmol/L (ref 20–32)
Creat: 0.81 mg/dL (ref 0.70–1.33)
GFR, EST NON AFRICAN AMERICAN: 99 mL/min/{1.73_m2} (ref >=60)
GFR, Est African American: 114 mL/min/{1.73_m2} (ref >=60)
Globulin: 2.7 g/dL (calc) (ref 1.9–3.7)
Glucose, Bld: 82 mg/dL (ref 65–99)
POTASSIUM: 4.3 mmol/L (ref 3.5–5.3)
Sodium: 139 mmol/L (ref 135–146)
Total Protein: 6.9 g/dL (ref 6.1–8.1)

## 2018-07-05 LAB — LIPID PANEL
Cholesterol: 194 mg/dL (ref <200)
HDL: 51 mg/dL (ref >40)
LDL Cholesterol (Calc): 111 mg/dL (calc) — ABNORMAL HIGH
NON-HDL CHOLESTEROL (CALC): 143 mg/dL — AB (ref <130)
Total CHOL/HDL Ratio: 3.8 (calc) (ref <5.0)
Triglycerides: 208 mg/dL — ABNORMAL HIGH (ref <150)

## 2018-07-05 LAB — HEMOGLOBIN A1C
EAG (MMOL/L): 6.5 (calc)
Hgb A1c MFr Bld: 5.7 % of total Hgb — ABNORMAL HIGH (ref <5.7)
MEAN PLASMA GLUCOSE: 117 (calc)

## 2018-08-29 ENCOUNTER — Other Ambulatory Visit: Payer: Self-pay | Admitting: Family Medicine

## 2018-08-29 DIAGNOSIS — I10 Essential (primary) hypertension: Secondary | ICD-10-CM

## 2018-08-29 NOTE — Telephone Encounter (Signed)
Copied from [place] [id number]. Topic: Quick Communication - Rx Refill/Question >> Aug 29, 2018  8:19 AM Andria Frames L wrote: Medication: spironolactone (ALDACTONE) 25 MG tablet [[id number]] amLODipine (NORVASC) 10 MG tablet [[id number]] atenolol (TENORMIN) 50 MG tablet [[id number]] hydrochlorothiazide (HYDRODIURIL) 12.5 MG tablet [[id number]]    Has the patient contacted their pharmacy? No Preferred Pharmacy (with phone number or street name):[facility] - [place], [place] - [address] [phone] (Phone) [phone] (Fax)   Agent: Please be advised that RX refills may take up to 3 business days. We ask that you follow-up with your pharmacy.

## 2018-08-29 NOTE — Telephone Encounter (Signed)
Please my prescription history I sent a year's supply of all of these medicines to the same pharmacy, [facility], in July of 2019 I should not need to be involved until July 2020 Please resolve with pharmacy

## 2018-08-29 NOTE — Telephone Encounter (Signed)
Left detailed voicemail for patient to contact pharmacy

## 2018-11-08 DIAGNOSIS — M170 Bilateral primary osteoarthritis of knee: Secondary | ICD-10-CM | POA: Diagnosis not present

## 2018-11-08 DIAGNOSIS — Z87820 Personal history of traumatic brain injury: Secondary | ICD-10-CM | POA: Diagnosis not present

## 2018-11-08 DIAGNOSIS — H209 Unspecified iridocyclitis: Secondary | ICD-10-CM | POA: Diagnosis not present

## 2018-11-08 DIAGNOSIS — Z7952 Long term (current) use of systemic steroids: Secondary | ICD-10-CM | POA: Diagnosis not present

## 2018-11-08 DIAGNOSIS — M25562 Pain in left knee: Secondary | ICD-10-CM | POA: Diagnosis not present

## 2018-11-08 DIAGNOSIS — I10 Essential (primary) hypertension: Secondary | ICD-10-CM | POA: Diagnosis not present

## 2018-11-08 DIAGNOSIS — R936 Abnormal findings on diagnostic imaging of limbs: Secondary | ICD-10-CM | POA: Diagnosis not present

## 2018-11-08 DIAGNOSIS — Z79899 Other long term (current) drug therapy: Secondary | ICD-10-CM | POA: Diagnosis not present

## 2018-11-08 DIAGNOSIS — M25561 Pain in right knee: Secondary | ICD-10-CM | POA: Diagnosis not present

## 2018-11-08 DIAGNOSIS — M25571 Pain in right ankle and joints of right foot: Secondary | ICD-10-CM | POA: Diagnosis not present

## 2018-11-08 DIAGNOSIS — M0608 Rheumatoid arthritis without rheumatoid factor, vertebrae: Secondary | ICD-10-CM | POA: Diagnosis not present

## 2018-11-08 DIAGNOSIS — M0230 Reiter's disease, unspecified site: Secondary | ICD-10-CM | POA: Diagnosis not present

## 2018-11-08 DIAGNOSIS — E785 Hyperlipidemia, unspecified: Secondary | ICD-10-CM | POA: Diagnosis not present

## 2018-11-08 DIAGNOSIS — M25572 Pain in left ankle and joints of left foot: Secondary | ICD-10-CM | POA: Diagnosis not present

## 2018-11-08 DIAGNOSIS — M1993 Secondary osteoarthritis, unspecified site: Secondary | ICD-10-CM | POA: Diagnosis not present

## 2019-01-04 ENCOUNTER — Ambulatory Visit: Payer: Medicare HMO

## 2019-01-04 ENCOUNTER — Ambulatory Visit: Payer: Medicare HMO | Admitting: Family Medicine

## 2019-01-14 DIAGNOSIS — T1501XA Foreign body in cornea, right eye, initial encounter: Secondary | ICD-10-CM | POA: Diagnosis not present

## 2019-03-25 ENCOUNTER — Other Ambulatory Visit: Payer: Self-pay

## 2019-03-25 ENCOUNTER — Telehealth: Payer: Self-pay | Admitting: Family Medicine

## 2019-03-25 DIAGNOSIS — I10 Essential (primary) hypertension: Secondary | ICD-10-CM

## 2019-03-25 MED ORDER — ATORVASTATIN CALCIUM 20 MG PO TABS: ORAL_TABLET | ORAL | 0 refills | Status: DC

## 2019-03-25 NOTE — Telephone Encounter (Signed)
Tried called pt for scheduling and there was no answer, no option for VM

## 2019-03-25 NOTE — Telephone Encounter (Signed)
Requested medication (s) are due for refill today: yes  Requested medication (s) are on the active medication list: yes  Last refill:  11/23/2018  Future visit scheduled: no  Notes to clinic:  Review for refill   Requested Prescriptions  Pending Prescriptions Disp Refills   atenolol (TENORMIN) 50 MG tablet [Pharmacy Med Name: ATENOLOL 50 MG Tablet] 90 tablet 3    Sig: Take 1 tablet (50 mg total) by mouth daily.     Cardiovascular:  Beta Blockers Failed - 03/25/2019  8:37 AM      Failed - Valid encounter within last 6 months    Recent Outpatient Visits          8 months ago Essential hypertension   [place], Satira Anis, MD   9 months ago Community acquired pneumonia of left upper lobe of lung [facility])   [place], NP   9 months ago Diarrhea, unspecified type   [place], FNP   1 year ago Abscess of multiple sites   [facility] [place], [place]   1 year ago Cutaneous abscess of right axilla   [place], [place]             Passed - Last BP in normal range    BP Readings from Last 1 Encounters:  07/04/18 128/70         Passed - Last Heart Rate in normal range    Pulse Readings from Last 1 Encounters:  07/04/18 64          amLODipine (NORVASC) 10 MG tablet [Pharmacy Med Name: AMLODIPINE BESYLATE 10 MG Tablet] 90 tablet 3    Sig: TAKE 1 TABLET EVERY DAY     Cardiovascular:  Calcium Channel Blockers Failed - 03/25/2019  8:37 AM      Failed - Valid encounter within last 6 months    Recent Outpatient Visits          8 months ago Essential hypertension   [place], Satira Anis, MD   9 months ago Community acquired pneumonia of left upper lobe of lung [facility])   [facility] Fredderick Severance, NP   9 months ago Diarrhea, unspecified type   [place], FNP   1 year ago Abscess of multiple sites   [place], FNP   1 year ago Cutaneous abscess of right axilla   [place], [place]             Passed - Last BP in normal range    BP Readings from Last 1 Encounters:  07/04/18 128/70          hydrochlorothiazide (HYDRODIURIL) 12.5 MG tablet [Pharmacy Med Name: HYDROCHLOROTHIAZIDE 12.5 MG Tablet] 90 tablet 3    Sig: Take 1 tablet (12.5 mg total) by mouth daily.     Cardiovascular: Diuretics - Thiazide Failed - 03/25/2019  8:37 AM      Failed - Valid encounter within last 6 months    Recent Outpatient Visits          8 months ago Essential hypertension   [place], Satira Anis, MD   9 months ago Community acquired pneumonia of left upper lobe of lung [facility])   [place], NP  9 months ago Diarrhea, unspecified type   [place], Astrid Divine, FNP   1 year ago Abscess of multiple sites   [place], [place]   1 year ago Cutaneous abscess of right axilla   [facility] [place], Astrid Divine, [place]             Passed - Ca in normal range and within 360 days    Calcium  Date Value Ref Range Status  07/04/2018 9.2 8.6 - 10.3 mg/dL Final         Passed - Cr in normal range and within 360 days    Creat  Date Value Ref Range Status  07/04/2018 0.81 0.70 - 1.33 mg/dL Final    Comment:    For patients >81 years of age, the reference limit for Creatinine is approximately 13% higher for people identified as African-American. .          Passed - K in normal range and within 360 days    Potassium  Date Value Ref Range Status  07/04/2018 4.3 3.5 - 5.3 mmol/L Final         Passed - Na in normal range and within 360 days    Sodium  Date Value Ref Range Status  07/04/2018 139 135 - 146 mmol/L Final   02/09/2016 143 134 - 144 mmol/L Final         Passed - Last BP in normal range    BP Readings from Last 1 Encounters:  07/04/18 128/70          spironolactone (ALDACTONE) 25 MG tablet [Pharmacy Med Name: SPIRONOLACTONE 25 MG Tablet] 180 tablet 3    Sig: TAKE 1 TABLET TWICE DAILY     Cardiovascular: Diuretics - Aldosterone Antagonist Failed - 03/25/2019  8:37 AM      Failed - Valid encounter within last 6 months    Recent Outpatient Visits          8 months ago Essential hypertension   [place], Satira Anis, MD   9 months ago Community acquired pneumonia of left upper lobe of lung [facility])   [facility] Fredderick Severance, NP   9 months ago Diarrhea, unspecified type   [place], FNP   1 year ago Abscess of multiple sites   [place], [place]   1 year ago Cutaneous abscess of right axilla   [place], [place]             Passed - Cr in normal range and within 360 days    Creat  Date Value Ref Range Status  07/04/2018 0.81 0.70 - 1.33 mg/dL Final    Comment:    For patients >27 years of age, the reference limit for Creatinine is approximately 13% higher for people identified as African-American. .          Passed - K in normal range and within 360 days    Potassium  Date Value Ref Range Status  07/04/2018 4.3 3.5 - 5.3 mmol/L Final         Passed - Na in normal range and within 360 days    Sodium  Date Value Ref Range Status  07/04/2018 139 135 - 146 mmol/L Final  02/09/2016 143 134 - 144 mmol/L Final         Passed -  Last BP in normal range    BP Readings from Last 1 Encounters:  07/04/18 128/70

## 2019-04-10 ENCOUNTER — Ambulatory Visit (INDEPENDENT_AMBULATORY_CARE_PROVIDER_SITE_OTHER): Payer: Medicare HMO | Admitting: Family Medicine

## 2019-04-10 ENCOUNTER — Ambulatory Visit: Payer: Medicare HMO

## 2019-04-10 ENCOUNTER — Encounter: Payer: Self-pay | Admitting: Family Medicine

## 2019-04-10 ENCOUNTER — Other Ambulatory Visit: Payer: Self-pay

## 2019-04-10 VITALS — BP 122/78 | HR 66 | Temp 97.8°F | Resp 14 | Ht 72.0 in | Wt 212.7 lb

## 2019-04-10 DIAGNOSIS — Z5181 Encounter for therapeutic drug level monitoring: Secondary | ICD-10-CM

## 2019-04-10 DIAGNOSIS — R7303 Prediabetes: Secondary | ICD-10-CM

## 2019-04-10 DIAGNOSIS — I10 Essential (primary) hypertension: Secondary | ICD-10-CM | POA: Diagnosis not present

## 2019-04-10 DIAGNOSIS — Z23 Encounter for immunization: Secondary | ICD-10-CM | POA: Diagnosis not present

## 2019-04-10 DIAGNOSIS — E782 Mixed hyperlipidemia: Secondary | ICD-10-CM

## 2019-04-10 MED ORDER — ATENOLOL 50 MG PO TABS: 50.0000 mg | ORAL_TABLET | Freq: Every day | ORAL | 1 refills | Status: DC

## 2019-04-10 MED ORDER — SPIRONOLACTONE 25 MG PO TABS: 25.0000 mg | ORAL_TABLET | Freq: Two times a day (BID) | ORAL | 1 refills | Status: DC

## 2019-04-10 MED ORDER — HYDROCHLOROTHIAZIDE 12.5 MG PO TABS: 12.5000 mg | ORAL_TABLET | Freq: Every day | ORAL | 1 refills | Status: DC

## 2019-04-10 MED ORDER — AMLODIPINE BESYLATE 10 MG PO TABS: 10.0000 mg | ORAL_TABLET | Freq: Every day | ORAL | 1 refills | Status: DC
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
## 2019-04-11 LAB — CBC WITH DIFFERENTIAL/PLATELET
Absolute Monocytes: 507 cells/uL (ref 200–950)
Basophils Absolute: 33 cells/uL (ref 0–200)
Basophils Relative: 0.5 %
Eosinophils Absolute: 163 cells/uL (ref 15–500)
Eosinophils Relative: 2.5 %
HCT: 39.1 % (ref 38.5–50.0)
Hemoglobin: 13.2 g/dL (ref 13.2–17.1)
Lymphs Abs: 2191 cells/uL (ref 850–3900)
MCH: 28.8 pg (ref 27.0–33.0)
MCHC: 33.8 g/dL (ref 32.0–36.0)
MCV: 85.2 fL (ref 80.0–100.0)
MPV: 10.4 fL (ref 7.5–12.5)
Monocytes Relative: 7.8 %
Neutro Abs: 3608 cells/uL (ref 1500–7800)
Neutrophils Relative %: 55.5 %
Platelets: 297 10*3/uL (ref 140–400)
RBC: 4.59 10*6/uL (ref 4.20–5.80)
RDW: 12.8 % (ref 11.0–15.0)
Total Lymphocyte: 33.7 %
WBC: 6.5 10*3/uL (ref 3.8–10.8)

## 2019-04-11 LAB — HEMOGLOBIN A1C
Hgb A1c MFr Bld: 5.5 % of total Hgb (ref <5.7)
Mean Plasma Glucose: 111 (calc)
eAG (mmol/L): 6.2 (calc)

## 2019-04-11 LAB — COMPLETE METABOLIC PANEL WITH GFR
AG Ratio: 1.6 (calc) (ref 1.0–2.5)
ALT: 20 U/L (ref 9–46)
AST: 14 U/L (ref 10–35)
Albumin: 4.1 g/dL (ref 3.6–5.1)
Alkaline phosphatase (APISO): 119 U/L (ref 35–144)
BUN: 14 mg/dL (ref 7–25)
CO2: 30 mmol/L (ref 20–32)
Calcium: 9.2 mg/dL (ref 8.6–10.3)
Chloride: 103 mmol/L (ref 98–110)
Creat: 0.92 mg/dL (ref 0.70–1.33)
GFR, Est African American: 106 mL/min/{1.73_m2} (ref >=60)
GFR, Est Non African American: 91 mL/min/{1.73_m2} (ref >=60)
Globulin: 2.6 g/dL (calc) (ref 1.9–3.7)
Glucose, Bld: 103 mg/dL — ABNORMAL HIGH (ref 65–99)
Potassium: 4 mmol/L (ref 3.5–5.3)
Sodium: 140 mmol/L (ref 135–146)
Total Bilirubin: 0.3 mg/dL (ref 0.2–1.2)
Total Protein: 6.7 g/dL (ref 6.1–8.1)

## 2019-04-11 LAB — LIPID PANEL
Cholesterol: 126 mg/dL (ref <200)
HDL: 43 mg/dL (ref >=40)
LDL Cholesterol (Calc): 56 mg/dL (calc)
Non-HDL Cholesterol (Calc): 83 mg/dL (calc) (ref <130)
Total CHOL/HDL Ratio: 2.9 (calc) (ref <5.0)
Triglycerides: 199 mg/dL — ABNORMAL HIGH (ref <150)

## 2019-04-23 ENCOUNTER — Telehealth: Payer: Self-pay | Admitting: Family Medicine

## 2019-04-23 NOTE — Chronic Care Management (AMB) (Signed)
°  Chronic Care Management   Outreach Note  04/23/2019 Name: Franklin Richardson MRN: [id number] DOB: January 13, 1961  Referred by: Delsa Grana, PA-C Reason for referral : Chronic Care Management (Initial CCM outreach was unsuccessful.)   An unsuccessful telephone outreach was attempted today. The patient was referred to the case management team by for assistance with care management and care coordination.   Follow Up Plan: A HIPPA compliant phone message was left for the patient providing contact information and requesting a return call.  The care management team will reach out to the patient again over the next 7 days.  If patient returns call to provider office, please advise to call [facility] [place]  [email or web address]   ??[phone]

## 2019-04-24 NOTE — Chronic Care Management (AMB) (Signed)
Chronic Care Management   Note  04/24/2019 Name: BOSS DANIELSEN MRN: [id number] DOB: Aug 01, 1960  JOLAN UPCHURCH is a 58 y.o. year old male who is a primary care patient of Delsa Grana, [place]. I reached out to [name] by phone today in response to a referral sent by Mr. Arg[facility] health plan.     Mr. Hellard was given information about Chronic Care Management services today including:  1. CCM service includes personalized support from designated clinical staff supervised by his physician, including individualized plan of care and coordination with other care providers 2. 24/7 contact phone numbers for assistance for urgent and routine care needs. 3. Service will only be billed when office clinical staff spend 20 minutes or more in a month to coordinate care. 4. Only one practitioner may furnish and bill the service in a calendar month. 5. The patient may stop CCM services at any time (effective at the end of the month) by phone call to the office staff. 6. The patient will be responsible for cost sharing (co-pay) of up to 20% of the service fee (after annual deductible is met).  Patient agreed to services and verbal consent obtained.   Follow up plan: Telephone appointment with CCM team member scheduled for: 05/24/2019  [place]  [email or web address]   ??[phone]

## 2019-05-21 DIAGNOSIS — M25571 Pain in right ankle and joints of right foot: Secondary | ICD-10-CM | POA: Diagnosis not present

## 2019-05-21 DIAGNOSIS — M25521 Pain in right elbow: Secondary | ICD-10-CM | POA: Diagnosis not present

## 2019-05-21 DIAGNOSIS — I10 Essential (primary) hypertension: Secondary | ICD-10-CM | POA: Diagnosis not present

## 2019-05-21 DIAGNOSIS — I739 Peripheral vascular disease, unspecified: Secondary | ICD-10-CM | POA: Diagnosis not present

## 2019-05-21 DIAGNOSIS — M79671 Pain in right foot: Secondary | ICD-10-CM | POA: Diagnosis not present

## 2019-05-21 DIAGNOSIS — M79675 Pain in left toe(s): Secondary | ICD-10-CM | POA: Diagnosis not present

## 2019-05-21 DIAGNOSIS — M79672 Pain in left foot: Secondary | ICD-10-CM | POA: Diagnosis not present

## 2019-05-21 DIAGNOSIS — E663 Overweight: Secondary | ICD-10-CM | POA: Diagnosis not present

## 2019-05-21 DIAGNOSIS — K228 Other specified diseases of esophagus: Secondary | ICD-10-CM | POA: Diagnosis not present

## 2019-05-21 DIAGNOSIS — M2469 Ankylosis, other specified joint: Secondary | ICD-10-CM | POA: Diagnosis not present

## 2019-05-21 DIAGNOSIS — M47819 Spondylosis without myelopathy or radiculopathy, site unspecified: Secondary | ICD-10-CM | POA: Diagnosis not present

## 2019-05-24 ENCOUNTER — Telehealth: Payer: Medicare HMO

## 2019-05-29 DIAGNOSIS — M2041 Other hammer toe(s) (acquired), right foot: Secondary | ICD-10-CM | POA: Diagnosis not present

## 2019-05-29 DIAGNOSIS — M069 Rheumatoid arthritis, unspecified: Secondary | ICD-10-CM | POA: Diagnosis not present

## 2019-05-29 DIAGNOSIS — M2021 Hallux rigidus, right foot: Secondary | ICD-10-CM | POA: Diagnosis not present

## 2019-05-29 DIAGNOSIS — M7741 Metatarsalgia, right foot: Secondary | ICD-10-CM | POA: Diagnosis not present

## 2019-05-29 DIAGNOSIS — M19071 Primary osteoarthritis, right ankle and foot: Secondary | ICD-10-CM | POA: Diagnosis not present

## 2019-05-29 DIAGNOSIS — M79671 Pain in right foot: Secondary | ICD-10-CM | POA: Diagnosis not present

## 2019-06-03 ENCOUNTER — Other Ambulatory Visit: Payer: Self-pay | Admitting: Podiatry

## 2019-06-07 ENCOUNTER — Telehealth: Payer: Self-pay | Admitting: Family Medicine

## 2019-06-07 NOTE — Telephone Encounter (Signed)
Pt wife is unsure. Pt wife states that the provider that is doing the surgery states it is up to his pcp. Please advise. Pt wife would like a call back. Please advise

## 2019-06-07 NOTE — Telephone Encounter (Signed)
Pt is having a surgery 07/02/2019, part of the forms require his PCP approval. Also, whether a CPE is recommended by PCP prior to surgery. Please advise Best contact: [phone]

## 2019-06-07 NOTE — Telephone Encounter (Signed)
Tried to call patient.  He will need surgical clearance if there is any kind of form that needs to be filled out prior.

## 2019-06-07 NOTE — Telephone Encounter (Signed)
Pt is sch for surgical clearance on 06/14/2019

## 2019-06-14 ENCOUNTER — Encounter: Payer: Self-pay | Admitting: Family Medicine

## 2019-06-14 ENCOUNTER — Other Ambulatory Visit: Payer: Self-pay

## 2019-06-14 ENCOUNTER — Ambulatory Visit (INDEPENDENT_AMBULATORY_CARE_PROVIDER_SITE_OTHER): Payer: Medicare HMO | Admitting: Family Medicine

## 2019-06-14 VITALS — BP 132/80 | HR 56 | Temp 98.5°F | Resp 14 | Ht 72.0 in | Wt 207.0 lb

## 2019-06-14 DIAGNOSIS — Z01818 Encounter for other preprocedural examination: Secondary | ICD-10-CM | POA: Diagnosis not present
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
## 2019-06-14 NOTE — Progress Notes (Deleted)
Pt is having surgery on right foot on his right toes, fixing hammer toes and doing fusions.  He has a preop questionnaire for him to take with him to surgery in [place], but no other paperwork.   Pt has no OSA, COPD, swallowing issues, HTN is well controlled, so is HLD, renal and liver function good, no anemia. Not on ASA, anti-platelet meds or OAGs, blood sugar well controlled.   ECG interpretation   Date: 06/14/19  Rate: 54  Rhythm: sinus bradycardia  QRS Axis: normal  Intervals: normal  ST/T Wave abnormalities: normal  Conduction Disutrbances: none  Narrative Interpretation: sinus bradycardia, normal ECG except for rate  Old EKG Reviewed: No significant changes noted , compared to October 03, 2017

## 2019-06-17 ENCOUNTER — Ambulatory Visit: Payer: Self-pay

## 2019-06-17 ENCOUNTER — Telehealth: Payer: Self-pay

## 2019-06-17 NOTE — Chronic Care Management (AMB) (Signed)
  Chronic Care Management   Outreach Note  06/17/2019 Name: Franklin Richardson MRN: [id number] DOB: July 10, 1960  Primary Care Provider: Delsa Grana, PA-C Reason for referral : Chronic Care Management   An unsuccessful telephone outreach was attempted today. [name] was referred to the case management team for assistance with care management and care coordination.    Follow Up Plan: The care management team will reach out to [name] again within the next two to three weeks.    [facility] Care Management [phone]

## 2019-06-19 ENCOUNTER — Encounter: Payer: Self-pay | Admitting: Podiatry

## 2019-06-19 ENCOUNTER — Other Ambulatory Visit: Payer: Self-pay

## 2019-06-25 ENCOUNTER — Ambulatory Visit: Payer: Medicare HMO | Admitting: Family Medicine

## 2019-07-01 ENCOUNTER — Other Ambulatory Visit
Admission: RE | Admit: 2019-07-01 | Discharge: 2019-07-01 | Disposition: A | Discharge status: Home / Self Care | Payer: Medicare HMO | Source: Ambulatory Visit | Attending: Podiatry | Admitting: Podiatry

## 2019-07-01 ENCOUNTER — Other Ambulatory Visit: Payer: Self-pay

## 2019-07-01 DIAGNOSIS — Z01812 Encounter for preprocedural laboratory examination: Secondary | ICD-10-CM | POA: Diagnosis not present

## 2019-07-01 DIAGNOSIS — Z20822 Contact with and (suspected) exposure to covid-19: Secondary | ICD-10-CM | POA: Diagnosis not present

## 2019-07-01 LAB — SARS CORONAVIRUS 2 (TAT 6-24 HRS): SARS Coronavirus 2: NEGATIVE
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
## 2019-07-02 ENCOUNTER — Encounter
Admission: RE | Disposition: A | Discharge status: Home / Self Care | Payer: Self-pay | Source: Home / Self Care | Attending: Podiatry

## 2019-07-02 ENCOUNTER — Ambulatory Visit: Payer: Medicare HMO | Admitting: Anesthesiology

## 2019-07-02 ENCOUNTER — Ambulatory Visit
Admission: RE | Admit: 2019-07-02 | Discharge: 2019-07-02 | Disposition: A | Discharge status: Home / Self Care | Payer: Medicare HMO | Attending: Podiatry | Admitting: Podiatry

## 2019-07-02 ENCOUNTER — Encounter: Payer: Self-pay | Admitting: Podiatry

## 2019-07-02 ENCOUNTER — Other Ambulatory Visit: Payer: Self-pay

## 2019-07-02 DIAGNOSIS — I10 Essential (primary) hypertension: Secondary | ICD-10-CM | POA: Insufficient documentation

## 2019-07-02 DIAGNOSIS — M19071 Primary osteoarthritis, right ankle and foot: Secondary | ICD-10-CM | POA: Diagnosis not present

## 2019-07-02 DIAGNOSIS — M7741 Metatarsalgia, right foot: Secondary | ICD-10-CM | POA: Insufficient documentation

## 2019-07-02 DIAGNOSIS — M2011 Hallux valgus (acquired), right foot: Secondary | ICD-10-CM | POA: Insufficient documentation

## 2019-07-02 DIAGNOSIS — Z8249 Family history of ischemic heart disease and other diseases of the circulatory system: Secondary | ICD-10-CM | POA: Diagnosis not present

## 2019-07-02 DIAGNOSIS — M25571 Pain in right ankle and joints of right foot: Secondary | ICD-10-CM | POA: Diagnosis not present

## 2019-07-02 DIAGNOSIS — M06871 Other specified rheumatoid arthritis, right ankle and foot: Secondary | ICD-10-CM | POA: Diagnosis not present

## 2019-07-02 DIAGNOSIS — M0600 Rheumatoid arthritis without rheumatoid factor, unspecified site: Secondary | ICD-10-CM | POA: Insufficient documentation

## 2019-07-02 DIAGNOSIS — M2041 Other hammer toe(s) (acquired), right foot: Secondary | ICD-10-CM | POA: Diagnosis not present

## 2019-07-02 DIAGNOSIS — M205X1 Other deformities of toe(s) (acquired), right foot: Secondary | ICD-10-CM | POA: Diagnosis not present

## 2019-07-02 DIAGNOSIS — Z09 Encounter for follow-up examination after completed treatment for conditions other than malignant neoplasm: Secondary | ICD-10-CM

## 2019-07-02 DIAGNOSIS — G8918 Other acute postprocedural pain: Secondary | ICD-10-CM | POA: Diagnosis not present

## 2019-07-02 DIAGNOSIS — M2021 Hallux rigidus, right foot: Secondary | ICD-10-CM | POA: Diagnosis not present

## 2019-07-02 HISTORY — DX: Presence of dental prosthetic device (complete) (partial): Z97.2

## 2019-07-02 HISTORY — PX: ARTHRODESIS METATARSALPHALANGEAL JOINT (MTPJ): SHX6566

## 2019-07-02 HISTORY — PX: HAMMER TOE SURGERY: SHX385

## 2019-07-02 HISTORY — PX: METATARSAL HEAD EXCISION: SHX5027

## 2019-07-02 HISTORY — DX: Hyperlipidemia, unspecified: E78.5

## 2019-07-02 SURGERY — FUSION, JOINT, GREAT TOE
Anesthesia: General | Site: Toe | Laterality: Right

## 2019-07-02 MED ORDER — CEFAZOLIN SODIUM-DEXTROSE 2-4 GM/100ML-% IV SOLN
2.0000 g | INTRAVENOUS | Status: AC
  Administered 2019-07-02: 12:00:00 2 g via INTRAVENOUS

## 2019-07-02 MED ORDER — MIDAZOLAM HCL 2 MG/2ML IJ SOLN
INTRAMUSCULAR | Status: DC | PRN
  Administered 2019-07-02: 2 mg via INTRAVENOUS

## 2019-07-02 MED ORDER — LACTATED RINGERS IV SOLN: INTRAVENOUS | Status: DC | PRN

## 2019-07-02 MED ORDER — ROPIVACAINE HCL 5 MG/ML IJ SOLN
INTRAMUSCULAR | Status: DC | PRN
  Administered 2019-07-02: 40 mL via EPIDURAL

## 2019-07-02 MED ORDER — PROPOFOL 500 MG/50ML IV EMUL
INTRAVENOUS | Status: DC | PRN
  Administered 2019-07-02: 5 ug/kg/min via INTRAVENOUS

## 2019-07-02 MED ORDER — POVIDONE-IODINE 7.5 % EX SOLN: Freq: Once | CUTANEOUS | Status: AC

## 2019-07-02 MED ORDER — ENOXAPARIN SODIUM 40 MG/0.4ML ~~LOC~~ SOLN: 40.0000 mg | SUBCUTANEOUS | 0 refills | Status: DC

## 2019-07-02 MED ORDER — OXYCODONE-ACETAMINOPHEN 7.5-325 MG PO TABS: 1.0000 | ORAL_TABLET | Freq: Four times a day (QID) | ORAL | 0 refills | Status: AC | PRN

## 2019-07-02 MED ORDER — ONDANSETRON HCL 4 MG/2ML IJ SOLN
INTRAMUSCULAR | Status: DC | PRN
  Administered 2019-07-02: 4 mg via INTRAVENOUS

## 2019-07-02 MED ORDER — CEPHALEXIN 500 MG PO CAPS: 500.0000 mg | ORAL_CAPSULE | Freq: Three times a day (TID) | ORAL | 0 refills | Status: AC

## 2019-07-02 MED ORDER — FENTANYL CITRATE (PF) 100 MCG/2ML IJ SOLN
INTRAMUSCULAR | Status: DC | PRN
  Administered 2019-07-02 (×2): 50 ug via INTRAVENOUS

## 2019-07-02 MED ORDER — ONDANSETRON HCL 4 MG PO TABS: 4.0000 mg | ORAL_TABLET | Freq: Three times a day (TID) | ORAL | 0 refills | Status: AC | PRN

## 2019-07-02 SURGICAL SUPPLY — 66 items
BENZOIN TINCTURE PRP APPL 2/3 (GAUZE/BANDAGES/DRESSINGS) ×3 IMPLANT
BIT DRILL 2 FENESTRATED (MISCELLANEOUS) IMPLANT
BIT DRILL 2.4X140 LONG SOLID (BIT) ×2 IMPLANT
BIT DRILL CANNULATED 2.3MM (DRILL) IMPLANT
BIT DRILL SOLID 2.0 X 110MM (DRILL) IMPLANT
BIT DRILLL 2 FENESTRATED (MISCELLANEOUS) ×2
BLADE OSC/SAGITTAL MD 5.5X18 (BLADE) ×2 IMPLANT
BLADE OSC/SAGITTAL MD 9X18.5 (BLADE) ×2 IMPLANT
BLADE SURG 15 STRL LF DISP TIS (BLADE) IMPLANT
BLADE SURG 15 STRL SS (BLADE) ×2
BNDG COHESIVE 4X5 TAN STRL (GAUZE/BANDAGES/DRESSINGS) ×3 IMPLANT
BNDG ESMARK 4X12 TAN STRL LF (GAUZE/BANDAGES/DRESSINGS) ×3 IMPLANT
BNDG GAUZE 4.5X4.1 6PLY STRL (MISCELLANEOUS) ×3 IMPLANT
BNDG STRETCH 4X75 STRL LF (GAUZE/BANDAGES/DRESSINGS) ×3 IMPLANT
CANISTER SUCT 1200ML W/VALVE (MISCELLANEOUS) ×3 IMPLANT
CONE REAMER 19MM (MISCELLANEOUS) ×2 IMPLANT
COUNTERSINK HEADED 3.5 (ORTHOPEDIC DISPOSABLE SUPPLIES) ×3
COVER LIGHT HANDLE UNIVERSAL (MISCELLANEOUS) ×6 IMPLANT
CUFF TOURN SGL QUICK 18X4 (TOURNIQUET CUFF) ×2 IMPLANT
CUP REAMER 21 (MISCELLANEOUS) ×2 IMPLANT
DRAPE FLUOR MINI C-ARM 54X84 (DRAPES) ×3 IMPLANT
DRILL CANNULATED 2.3MM (DRILL) ×3
DRILL SOLID 2.0 X 110MM (DRILL) ×3
DURAPREP 26ML APPLICATOR (WOUND CARE) ×3 IMPLANT
ELECT REM PT RETURN 9FT ADLT (ELECTROSURGICAL) ×3
ELECTRODE REM PT RTRN 9FT ADLT (ELECTROSURGICAL) ×1 IMPLANT
GAUZE SPONGE 4X4 12PLY STRL (GAUZE/BANDAGES/DRESSINGS) ×3 IMPLANT
GAUZE XEROFORM 1X8 LF (GAUZE/BANDAGES/DRESSINGS) ×3 IMPLANT
GOWN STRL REUS W/ TWL LRG LVL3 (GOWN DISPOSABLE) ×2 IMPLANT
GOWN STRL REUS W/TWL LRG LVL3 (GOWN DISPOSABLE) ×4
K-WIRE DBL END TROCAR 6X.045 (WIRE) ×9
K-WIRE DBL END TROCAR 6X.062 (WIRE) ×6
K-WIRE SMOOTH 1.6X150MM (WIRE) ×6
K-WIRE SNGL END 1.2X150 (MISCELLANEOUS) ×6
KIT TURNOVER KIT A (KITS) ×3 IMPLANT
KWIRE DBL END TROCAR 6X.045 (WIRE) IMPLANT
KWIRE DBL END TROCAR 6X.062 (WIRE) IMPLANT
KWIRE SMOOTH 1.6X150MM (WIRE) IMPLANT
KWIRE SNGL END 1.2X150 (MISCELLANEOUS) IMPLANT
NS IRRIG 500ML POUR BTL (IV SOLUTION) ×3 IMPLANT
PACK EXTREMITY ARMC (MISCELLANEOUS) ×3 IMPLANT
PENCIL SMOKE EVACUATOR (MISCELLANEOUS) ×3 IMPLANT
PIN BALLS 3/8 F/.045 WIRE (MISCELLANEOUS) ×7 IMPLANT
PLATE MED 5D ANGLED MTP RT (Plate) ×2 IMPLANT
RASP SM TEAR CROSS CUT (RASP) ×2 IMPLANT
SCREW 3.5 X 36 (Screw) ×2 IMPLANT
SCREW COUNTERSINK HEADED 3.5 (ORTHOPEDIC DISPOSABLE SUPPLIES) IMPLANT
SCREW LOCK 3 3.5X18 (Screw) ×2 IMPLANT
SCREW LOCK PLATE R3 3.5X14 (Screw) ×4 IMPLANT
SCREW LOCK PLATE R3 3.5X16 (Screw) ×4 IMPLANT
SCREW NON LOCK 3.5X18 (Screw) ×2 IMPLANT
SPLINT CAST 1 STEP 5X30 WHT (MISCELLANEOUS) ×2 IMPLANT
STOCKINETTE IMPERVIOUS LG (DRAPES) ×3 IMPLANT
SUT ETHILON 4-0 (SUTURE) ×12
SUT ETHILON 4-0 FS2 18XMFL BLK (SUTURE) ×6
SUT MNCRL 4-0 (SUTURE) ×2
SUT MNCRL 4-0 27XMFL (SUTURE) ×1
SUT VIC AB 3-0 SH 27 (SUTURE) ×2
SUT VIC AB 3-0 SH 27X BRD (SUTURE) IMPLANT
SUT VIC AB 4-0 SH 27 (SUTURE) ×4
SUT VIC AB 4-0 SH 27XANBCTRL (SUTURE) IMPLANT
SUTURE ETHLN 4-0 FS2 18XMF BLK (SUTURE) IMPLANT
SUTURE MNCRL 4-0 27XMF (SUTURE) IMPLANT
WIRE OLIVE SMOOTH 1.4MMX60MM (WIRE) ×4 IMPLANT
k-wire 1600-645 ×6 IMPLANT
k-wire 1600-662 ×2 IMPLANT

## 2019-07-02 NOTE — Discharge Instructions (Signed)
[place] DR. TROXLER, DR. Vickki Muff, AND DR. Miller   1. Take your medication as prescribed.  Pain medication should be taken only as needed.  Take 1 Percocet every 6 hours as needed.  If still having pain then take the same pain medication as well as Tylenol or ibuprofen between doses.  If still having pain then it is okay to take 2 Percocets every 6 hours as needed along with the Tylenol and ibuprofen between doses.  If still having pain then it is okay to take 1 Percocet every 4 hours as needed.  Pain is normal after the procedure and expected.  Please call in if you have any concerns.  2. Keep the dressing clean, dry and intact. 3. Remain nonweightbearing at all times to the surgical extremity on the right side. 4. Take antibiotic medication, antinausea medication, and Lovenox medication to prevent DVT.  5. Keep your foot elevated above the heart level for the first 48 hours.  6. Walking to the bathroom and brief periods of walking are acceptable, unless we have instructed you to be non-weight bearing.  7. Always wear your post-op shoe when walking.  Always use your crutches if you are to be non-weight bearing.  8. Do not take a shower. Baths are permissible as long as the foot is kept out of the water.   9. Every hour you are awake:  - Bend your knee 15 times. - Flex foot 15 times - Massage calf 15 times  10. Call [facility] [phone]) if any of the following problems occur: - You develop a temperature or fever. - The bandage becomes saturated with blood. - Medication does not stop your pain. - Injury of the foot occurs. - Any symptoms of infection including redness, odor, or red streaks running from wound.   General Anesthesia, Adult, Care After This sheet gives you information about how to care for yourself after your procedure. Your health care provider  may also give you more specific instructions. If you have problems or questions, contact your health care provider. What can I expect after the procedure? After the procedure, the following side effects are common:  Pain or discomfort at the IV site.  Nausea.  Vomiting.  Sore throat.  Trouble concentrating.  Feeling cold or chills.  Weak or tired.  Sleepiness and fatigue.  Soreness and body aches. These side effects can affect parts of the body that were not involved in surgery. Follow these instructions at home:  For at least 24 hours after the procedure:  Have a responsible adult stay with you. It is important to have someone help care for you until you are awake and alert.  Rest as needed.  Do not: ? Participate in activities in which you could fall or become injured. ? Drive. ? Use heavy machinery. ? Drink alcohol. ? Take sleeping pills or medicines that cause drowsiness. ? Make important decisions or sign legal documents. ? Take care of children on your own. Eating and drinking  Follow any instructions from your health care provider about eating or drinking restrictions.  When you feel hungry, start by eating small amounts of foods that are soft and easy to digest (bland), such as toast. Gradually return to your regular diet.  Drink enough fluid to keep your urine pale yellow.  If you vomit, rehydrate by drinking water, juice, or clear broth. General instructions  If you have sleep  apnea, surgery and certain medicines can increase your risk for breathing problems. Follow instructions from your health care provider about wearing your sleep device: ? Anytime you are sleeping, including during daytime naps. ? While taking prescription pain medicines, sleeping medicines, or medicines that make you drowsy.  Return to your normal activities as told by your health care provider. Ask your health care provider what activities are safe for you.  Take over-the-counter  and prescription medicines only as told by your health care provider.  If you smoke, do not smoke without supervision.  Keep all follow-up visits as told by your health care provider. This is important. Contact a health care provider if:  You have nausea or vomiting that does not get better with medicine.  You cannot eat or drink without vomiting.  You have pain that does not get better with medicine.  You are unable to pass urine.  You develop a skin rash.  You have a fever.  You have redness around your IV site that gets worse. Get help right away if:  You have difficulty breathing.  You have chest pain.  You have blood in your urine or stool, or you vomit blood. Summary  After the procedure, it is common to have a sore throat or nausea. It is also common to feel tired.  Have a responsible adult stay with you for the first 24 hours after general anesthesia. It is important to have someone help care for you until you are awake and alert.  When you feel hungry, start by eating small amounts of foods that are soft and easy to digest (bland), such as toast. Gradually return to your regular diet.  Drink enough fluid to keep your urine pale yellow.  Return to your normal activities as told by your health care provider. Ask your health care provider what activities are safe for you. This information is not intended to replace advice given to you by your health care provider. Make sure you discuss any questions you have with your health care provider. Document Released: 09/19/2000 Document Revised: 06/16/2017 Document Reviewed: 01/27/2017 Elsevier Patient Education  2020 [employer].

## 2019-07-02 NOTE — Anesthesia Procedure Notes (Signed)
Performed by: Mahir Prabhakar, CRNA Pre-anesthesia Checklist: Patient identified, Emergency Drugs available, Suction available, Timeout performed and Patient being monitored Patient Re-evaluated:Patient Re-evaluated prior to induction Oxygen Delivery Method: Simple face mask Placement Confirmation: positive ETCO2       

## 2019-07-02 NOTE — Anesthesia Preprocedure Evaluation (Signed)
Anesthesia Evaluation  Patient identified by MRN, date of birth, ID band Patient awake    Reviewed: Allergy & Precautions, H&P , NPO status , Patient's Chart, lab work & pertinent test results  Airway Mallampati: II  TM Distance: >3 FB Neck ROM: full    Dental no notable dental hx. (+) Partial Upper   Pulmonary    Pulmonary exam normal breath sounds clear to auscultation       Cardiovascular hypertension, Normal cardiovascular exam Rhythm:regular Rate:Normal     Neuro/Psych    GI/Hepatic   Endo/Other    Renal/GU      Musculoskeletal   Abdominal   Peds  Hematology   Anesthesia Other Findings   Reproductive/Obstetrics                            Anesthesia Physical Anesthesia Plan  ASA: II  Anesthesia Plan: General LMA   Post-op Pain Management:  Regional for Post-op pain   Induction:   PONV Risk Score and Plan: 2 and Ondansetron, Dexamethasone, Midazolam and Treatment may vary due to age or medical condition  Airway Management Planned:   Additional Equipment:   Intra-op Plan:   Post-operative Plan:   Informed Consent: I have reviewed the patients History and Physical, chart, labs and discussed the procedure including the risks, benefits and alternatives for the proposed anesthesia with the patient or authorized representative who has indicated his/her understanding and acceptance.       Plan Discussed with: CRNA  Anesthesia Plan Comments:         Anesthesia Quick Evaluation

## 2019-07-02 NOTE — Anesthesia Postprocedure Evaluation (Signed)
Anesthesia Post Note  Patient: Franklin Richardson  Procedure(s) Performed: ARTHRODESIS METATARSALPHALANGEAL JOINT (MTPJ) (Right Toe) METATARSAL HEAD EXCISION 2-5 (Right Toe) HAMMER TOE CORRECTION (Right Toe)     Patient location during evaluation: PACU Anesthesia Type: General Level of consciousness: awake and alert and oriented Pain management: satisfactory to patient Vital Signs Assessment: post-procedure vital signs reviewed and stable Respiratory status: spontaneous breathing, nonlabored ventilation and respiratory function stable Cardiovascular status: blood pressure returned to baseline and stable Postop Assessment: Adequate PO intake and No signs of nausea or vomiting Anesthetic complications: no    Raliegh Ip

## 2019-07-02 NOTE — Progress Notes (Signed)
Assisted Franklin Richardson ANMD  with popliteal/saphenous block. Side rails up, monitors on throughout procedure. See vital signs in flow sheet. Tolerated Procedure well.  

## 2019-07-02 NOTE — Anesthesia Procedure Notes (Signed)
Anesthesia Regional Block: Adductor canal block   Pre-Anesthetic Checklist: ,, timeout performed, Correct Patient, Correct Site, Correct Laterality, Correct Procedure, Correct Position, site marked, Risks and benefits discussed,  Surgical consent,  Pre-op evaluation,  At surgeon's request and post-op pain management  Laterality: Right  Prep: chloraprep       Needles:  Injection technique: Single-shot  Needle Type: Echogenic Needle     Needle Length: 9cm  Needle Gauge: 21     Additional Needles:   Procedures:,,,, ultrasound used (permanent image in chart),,,,  Narrative:  Start time: 07/02/2019 11:13 AM End time: 07/02/2019 11:27 AM Injection made incrementally with aspirations every 5 mL.  Performed by: Personally  Anesthesiologist: Ronelle Nigh, MD  Additional Notes: Functioning IV was confirmed and monitors applied. Ultrasound guidance: relevant anatomy identified, needle position confirmed, local anesthetic spread visualized around nerve(s)., vascular puncture avoided.  Image printed for medical record.  Negative aspiration and no paresthesias; incremental administration of local anesthetic. The patient tolerated the procedure well. Vitals signes recorded in RN notes. 15 ml

## 2019-07-02 NOTE — Transfer of Care (Signed)
Immediate Anesthesia Transfer of Care Note  Patient: Franklin Richardson  Procedure(s) Performed: ARTHRODESIS METATARSALPHALANGEAL JOINT (MTPJ) (Right Toe) METATARSAL HEAD EXCISION 2-5 (Right Toe) HAMMER TOE CORRECTION (Right Toe)  Patient Location: PACU  Anesthesia Type: General LMA  Level of Consciousness: awake, alert  and patient cooperative  Airway and Oxygen Therapy: Patient Spontanous Breathing and Patient connected to supplemental oxygen  Post-op Assessment: Post-op Vital signs reviewed, Patient's Cardiovascular Status Stable, Respiratory Function Stable, Patent Airway and No signs of Nausea or vomiting  Post-op Vital Signs: Reviewed and stable  Complications: No apparent anesthesia complications

## 2019-07-02 NOTE — Op Note (Addendum)
PODIATRY / FOOT AND ANKLE SURGERY OPERATIVE REPORT    SURGEON: Caroline More, DPM  PRE-OPERATIVE DIAGNOSIS:  1.  Rheumatoid arthritis 2.  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  Primary osteoarthritis right forefoot 4.  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 Right first metatarsal phalangeal joint fusion 2. Pan metatarsal head resection metatarsal heads 2, 3, 4, 5 right 3. Hammertoe repair digits 2, 3, 4, 5 right  HEMOSTASIS: Right ankle tourniquet  ANESTHESIA: MAC, preop popliteal and saphenous nerve block  ESTIMATED BLOOD LOSS: 50 cc  FINDING(S): 1.  Arthritic changes seen to the first metatarsal phalangeal joints with severe degeneration of cartilage at the first metatarsal head 2.  Severe degenerative changes seen at metatarsal phalangeal joints 2, 3, 4, 5 with bulbous enlarged bone formations. 3.  Very thin skin over the first metatarsal phalangeal joint level 4.  Severe arthritic changes to digits 2, 3, 4, 5 with rigid contractures  PATHOLOGY/SPECIMEN(S): None taken  INDICATIONS:   Franklin Richardson is a 59 y.o. male who presents with pain to his right forefoot with severe deformity.  Patient has maximal pain at the first metatarsal phalangeal joints as well as digits 2, 3, 4, 5 metatarsal phalangeal joints associated.  All treatment options were discussed with the patient both conservative and surgical attempts at correction.  Patient has exhausted all conservative therapies at this time would like to proceed with surgical intervention.  All treatment options again were discussed with patient both conservative and surgical attempts at correction include potential risks and complications of surgical intervention at this time the patient is elected for surgery consisting of right first metatarsal phalangeal joint fusion, pan metatarsal head resection, and hammertoe repair  2, 3, 4, 5.  All questions answered and postoperative course given in detail.  Patient understands and is agreeable to procedure  DESCRIPTION: After obtaining full informed written consent, the patient was brought back to the operating room and placed supine upon the operating table.  A preoperative popliteal and saphenous nerve block was performed before the procedure.  A pneumatic ankle tourniquet was applied to the patient's right ankle. The patient received IV antibiotics prior to induction.  After obtaining adequate anesthesia, the patient was prepped and draped in the standard fashion.   An Esmarch bandage was used to exsanguinate the patient's right lower extremity and the pneumatic ankle tourniquet was inflated.  Attention was then directed to the dorsal aspect of the first metatarsal phalangeal joint where a linear longitudinal incision was made over this area.  The incision was made slightly medial to the tendon of the extensor hallucis longus and involved the contour deformity.  The incision was deepened to the subcutaneous tissues utilizing sharp and blunt dissection care was taken to identify and retract all vital neurovascular structures and all venous contributories were cauterized as necessary.  At this time a periosteal and capsular type of incision was made at the first metatarsal phalangeal joint level and the periosteum and capsular tissues was reflected medially and laterally thereby exposing the first metatarsal phalangeal joint at the operative site.  There was a large dorsal exostosis that appeared to be present which was resected with a sagittal bone saw and passed off in the operative site and the medial eminence was also resected and passed off in the operative site.  The articular cartilage at the head of the first metatarsal appeared to be eroded in  the dorsal medial aspect and appeared to have erosion of a portion of the central aspect as well with about 50% of the cartilage  remaining overall.  Similar changes were seen at the base of the proximal phalanx of the hallux at the articular cartilage level.  At this time a McClamary elevator was then used to open up the joint yet further and expose the entire joint surface.  A guidewire was then placed in the first metatarsal head.  This was used for the reamer and the reamer was then used to ream off the articular cartilage of the first metatarsal head.  The same thing was then performed to the base the proximal phalanx of the hallux no articular cartilage was removed.  Rongeur was used to remove any rough edges in the area and the sagittal saw was used to thin out the area further.  The surgical site was flushed out with copious amounts normal sterile saline.  A 2.0 drill bit was then used to fenestrate the joint surface at the first metatarsal phalangeal joint.  At this time the toe was then held in a rectus position and a guidewire for 3.5 cannulated partially threaded screw was placed at the medial base the proximal phalanx of the hallux and directed across the first metatarsal phalangeal joint and into the lateral neck area of the first metatarsal head.  This was done under fluoroscopic guidance which appeared to be excellent overall.  The position was then checked and the hallux appeared to sit in a rectus position overall with simulated weightbearing.  A small stab incision was then made around the area with in which the pin was placed.  Then utilizing standard AO principles and techniques a 3.5 x 36 mm partially-threaded cannulated Paragon 28 screw was placed with excellent compression noted across the first metatarsal phalangeal joint.  The Paragon 28 first metatarsophalangeal joint fusion plate was then placed at the dorsal aspect of the first metatarsal phalangeal joint and olive wires were used to temporarily fixate the plate.  C-arm imaging was utilized to verify position of plate which appeared to be excellent overall.   Utilizing standard AO principles and techniques the 3 distal locking screws were then filled first at the base the proximal phalanx of the hallux with 3.5 mm locking screws.  The olive wires were removed and the 3 proximal screw holes were then filled with two 3.5 locking screws and one 3.5 nonlocking screw.  The surgical site was flushed with copious amounts normal sterile saline.  Final C-arm imaging was then taken of the first metatarsal phalangeal joint showing rectus position and hardware placement of the appropriate placement overall.  The capsular and periosteal tissue was then reapproximated well coapted with 3-0 Vicryl.  The subcutaneous tissue was then reapproximated well coapted with 4-0 Vicryl.  The skin was then reapproximated well coapted with 4-0 nylon in a combination of simple and horizontal mattress type stitching.  There appeared to be very thin skin overall over this area and the tissues appeared to be somewhat tight with closure but adequate closure was able to be obtained under the least amount of tension as possible.  Attention was then directed to an interval between the second and third metatarsals where a linear longitudinal incision was made between this area.  The incision was deepened to the subcutaneous tissues utilizing sharp blunt dissection and care was taken to identify and retract all vital neurovascular structures all venous contributories were cauterized necessary the second metatarsal phalangeal  joint area was then identified and a periosteal and capsular incision was made in the spot and the capsular periosteal tissues were dissected free from all attachments to the joint exposing the second metatarsal head at the operative site.  The head appeared to be severely eroded and grossly hypertrophic with a large amount of excess bone formation.  The sagittal bone saw was used to resect the head of the proximal phalanx at the operative site which was removed at the level of the  neck and distal shaft.  The joint appeared to be decompressed once this was performed.  There also appeared to be a very large prominence at the base the proximal phalanx of the second toe which this was also resected with a sagittal bone saw and passed off in the operative site and all rough edges were then smoothed with a sagittal saw and rongeur.  The same procedure was then performed on the third metatarsal head area through the same incision and the third metatarsal head was passed off in the operative site.  The base the proximal phalanx of the third toe appeared to be eroded but did not appear to be closed grossly hypertrophic so the rongeur was used to remove some hypertrophic bone formation.  Attention was directed to the interval between the fourth and fifth metatarsals where a linear longitudinal incision was made between this area.  The incision was deepened to the subcutaneous tissues utilizing sharp and blunt dissection care was taken to identify and retract all vital neurovascular structures and all venous contributories were cauterized necessary.  The same procedure was performed removing the fourth and fifth metatarsal heads at the level of the neck keeping the appropriate parabola alignment leaving more dorsal than plantar.  The bases of the proximal phalanxes of the fourth and fifth toes were also cleaned up further removing hypertrophic bone formation with the rongeur and paddle rasp.  Attention was then directed to the second toe at the level of the DIPJ where a similar elliptical incision was made over this joint ellipsing the skin and some the subcutaneous tissue.  An extensor tenotomy was performed at the level of the DIPJ and the joint was able to be identified barely because it was so arthritic and hypertrophic.  The head of the middle phalanx was resected and passed off in the operative site along with a portion of the articular cartilage that remained of the base of the distal  phalanx.  The surgical site was flushed with copious amounts normal sterile saline.  A 0.062 K wire was then drilled retrograde through the distal phalanx and then back through the middle phalanx proximal phalanx and across the second metatarsal phalangeal joint while holding the toe in a rectus position.  This was done under fluoroscopic guidance.  The incision between the second and third metatarsals with it is then extended up the level of the third digit to the PIPJ.  The toe appeared to be dislocated dorsally at the level of the PIPJ but the proximal phalanx appeared to be dislocated plantarly to the third metatarsal head before the metatarsal head was resected.  After resection of the third metatarsal head the base of the phalanx appeared to go in a more rectus position overall but appeared to have a large palpable exostosis plantarly still.  Attention was directed to the PIPJ where an extensor tenotomy and capsulotomy was performed followed by release the collateral and suspensory ligaments.  The joint appeared to be severely eroded and hypertrophic.  The head of the proximal phalanx was resected and passed off in the operative site and the articular cartilage present at the base of the middle phalanx was resected and passed off in the operative site.  The surgical site was flushed with copious amounts normal sterile saline.  A 0.045 K wire was then retrograde drilled through the middle and distal phalanx and then drilled back through the proximal phalanx and across the third metatarsal phalangeal joint while holding the toe in a rectus position.  Attention was directed to the plantar medial aspect of the third digit at the level of the base and PIPJ of the third toe where a small 1 cm incision was made over this area and the incision was deepened to the subcutaneous tissues to the level of bone while retracting all neurovascular structures as needed.  At this time a rongeur was used to remove a bony  prominence that was present at this level and the paddle rasp was then placed at this point removing all bony prominence at this level.  This portion of the procedure was also done under fluoroscopic guidance.  The pneumatic ankle tourniquet was deflated after this portion of the case at the near 2-hour mark.  Capillary fill time appeared to be well intact to all digits and operative site.  Attention was then directed to the fourth digit at the level of the PIPJ where an elliptical incision was made ellipsing the skin and subcutaneous tissue.  An extensor tenotomy and capsulotomy was performed at the level of the PIPJ.  A release was performed of the collateral ligaments as well.  At this time a sagittal bone saw was used to remove the head of the proximal phalanx and the base of the middle phalanx removing all articular cartilage that remained.  The surgical site was flushed with copious amounts normal sterile saline.  The toe appeared to sit more rectus position overall and a 0.045 K wire was then retrograde drilled through the middle and distal phalanx and then retrograde drilled through the proximal phalanx and through the fourth metatarsal under fluoroscopic guidance.  The Esmarch bandage was used to exsanguinate the right lower extremity again and the pneumatic ankle tourniquet was inflated 1 more time.  Attention was then directed to the DIPJ of the right fifth toe where a semielliptical incision was made to make a derotational type of skin closure.  The skin was ellipsed along with the subcutaneous tissue and passed off the operative site.  An extensor tenotomy and capsulotomy was performed followed by release the collateral ligaments.  The joint appeared to be severely eroded at the level of the DIPJ as well with very little articular cartilage remaining.  The sagittal bone saw was then used to resect the head of the middle phalanx and base of the distal phalanx which was passed off the operative site.   The toe was flushed with copious amounts of normal sterile saline.  The toe was then held in a rectus position and a 0.045 K wire was then retrograde drilled through the distal phalanx and then through the middle phalanx proximal phalanx and fifth metatarsal under fluoroscopic guidance.  The pneumatic ankle tourniquet was then deflated and a prompt hyperemic response was seen to all areas of the operative site and all digits.  All remaining incisions were then flushed with copious amounts normal sterile saline that were not closed yet.  The capsular and periosteal tissues as well as extensor tendon at the level of  all incision sites were reapproximated well coapted with 3-0 Vicryl.  Subcutaneous tissue at all incision sites were reapproximated well coapted with 4-0 Vicryl.  The skin was then reapproximated well coapted with 4-0 nylon combination of simple and horizontal mattress type stitching.  Final C-arm imaging was then taken showing rectus position of the first metatarsal phalangeal joint with hardware intact and excellent compression noted.  Pins appear to be intact and toes appear to be in a more rectus position overall across metatarsal phalangeal joints and digits 2, 3, 4, 5.  The bony prominences that were apparent prior to the procedure appeared to be well reduced at this time as well.  Postoperative dressing was then applied consisting of Xeroform to the incision sites, triple antibiotic to the pins.  The pins were bent and cut and pin caps were also placed.  Dressing was then continued with 4 x 4 gauze, Kling, Kerlix, web roll, posterior splint, Ace wrap.  The patient tolerated the procedure and anesthesia well was transferred to recovery room fossae and stable vascular status intact all toes the right foot.  Following.  Postoperative monitoring the patient be discharged home the following written oral postop instructions, keep surgical dressings clean, dry, and intact, ice and elevate right lower  extremity when at rest, remain nonweightbearing to right lower extremity all times, take postoperative pain medication, antibiotics, antinausea, and blood thinning medication as prescribed, follow-up in 1 week for postoperative assessment and repeat x-ray imaging.  COMPLICATIONS: None  CONDITION: Good, stable  Caroline More, DPM

## 2019-07-02 NOTE — Anesthesia Procedure Notes (Signed)
Anesthesia Regional Block: Popliteal block   Pre-Anesthetic Checklist: ,, timeout performed, Correct Patient, Correct Site, Correct Laterality, Correct Procedure, Correct Position, site marked, Risks and benefits discussed,  Surgical consent,  Pre-op evaluation,  At surgeon's request and post-op pain management  Laterality: Right  Prep: chloraprep       Needles:  Injection technique: Single-shot  Needle Type: Echogenic Needle     Needle Length: 9cm  Needle Gauge: 21     Additional Needles:   Procedures:,,,, ultrasound used (permanent image in chart),,,,  Narrative:  Start time: 07/02/2019 11:13 AM End time: 07/02/2019 11:27 AM Injection made incrementally with aspirations every 5 mL.  Performed by: Personally  Anesthesiologist: Ronelle Nigh, MD  Additional Notes: Functioning IV was confirmed and monitors applied. Ultrasound guidance: relevant anatomy identified, needle position confirmed, local anesthetic spread visualized around nerve(s)., vascular puncture avoided.  Image printed for medical record.  Negative aspiration and no paresthesias; incremental administration of local anesthetic. The patient tolerated the procedure well. Vitals signes recorded in RN notes.25 ml.

## 2019-07-03 ENCOUNTER — Encounter: Payer: Self-pay | Admitting: *Deleted

## 2019-07-05 ENCOUNTER — Ambulatory Visit (INDEPENDENT_AMBULATORY_CARE_PROVIDER_SITE_OTHER): Payer: Medicare HMO

## 2019-07-05 DIAGNOSIS — I10 Essential (primary) hypertension: Secondary | ICD-10-CM | POA: Diagnosis not present

## 2019-07-05 DIAGNOSIS — E782 Mixed hyperlipidemia: Secondary | ICD-10-CM

## 2019-07-05 NOTE — Chronic Care Management (AMB) (Signed)
Chronic Care Management   Initial Visit Note  07/05/2019 Name: Franklin Richardson MRN: [id number] DOB: [birth year]  Primary Care Provider: Delsa Grana, PA-C Reason for referral : Chronic Care Management  Franklin Richardson is a 59 y.o. year old male who is a primary care patient of Delsa Grana, [place]. The CCM team was consulted for assistance with chronic disease management and care coordination. A telephonic assessment was conducted today.  Review of [name] status, including review of consultants reports, relevant labs and test results was conducted today. Collaboration with appropriate care team members was performed as part of the comprehensive evaluation and provision of chronic care management services.    SDOH (Social Determinants of Health) screening was performed today.  Medications: Outpatient Encounter Medications as of 07/05/2019  Medication Sig  . amLODipine (NORVASC) 10 MG tablet Take 1 tablet (10 mg total) by mouth daily.  [name] atenolol (TENORMIN) 50 MG tablet Take 1 tablet (50 mg total) by mouth daily.  [name] atorvastatin (LIPITOR) 20 MG tablet TAKE 1 TABLET AT BEDTIME (NEW DOSE)  . cephALEXin (KEFLEX) 500 MG capsule Take 1 capsule (500 mg total) by mouth 3 (three) times daily for 7 days.  . hydrochlorothiazide (HYDRODIURIL) 12.5 MG tablet Take 1 tablet (12.5 mg total) by mouth daily.  . Adalimumab 40 MG/0.8ML PNKT Inject 40 mg into the skin every 21 ( twenty-one) days. Every two weeks  . enoxaparin (LOVENOX) 40 MG/0.4ML injection Inject 0.4 mLs (40 mg total) into the skin daily.  . ondansetron (ZOFRAN) 4 MG tablet Take 1 tablet (4 mg total) by mouth every 8 (eight) hours as needed for up to 7 days for nausea or vomiting.  [name] oxyCODONE-acetaminophen (PERCOCET) 7.5-325 MG tablet Take 1 tablet by mouth every 6 (six) hours as needed for up to 7 days for severe pain.  [name] spironolactone (ALDACTONE) 25 MG tablet Take 1 tablet (25 mg total) by mouth 2 (two) times daily.   No facility-administered  encounter medications on file as of 07/05/2019.      Goals Addressed            This Visit's Progress   . Chronic Disease Management       Current Barriers:  . Chronic Disease Management support and education needs related to hypertension and hyperlipidemia.  Case Manager Clinical Goal(s):  [name] Over the next 90 days, patient will take all medications as prescribed. . Over the next 90 days, patient will monitor blood pressure and record readings. . Over the next 90 days, patient will adhere to recommended cardiac prudent/heart healthy diet. . Over the next 90 days, patient will follow recommended safety and fall prevention measures. . Over the next 90 days patient will attend all medical appointments as scheduled.  Interventions:  . Reviewed medications. Encouraged to take medications as prescribed and notify care management team with concerns regarding prescription costs. Reports self administering medications. Denies current concerns regarding medication management or prescription costs. . Provided education regarding established blood pressure parameters and indications for notifying provider. Encouraged to monitor at least three times a week if unable to monitor daily. Encouraged to record readings to identify trends. Reports not currently monitoring BP at home. . Discussed current nutritional intake and dietary options. Encouraged to comply with recommended cardiac prudent diet. . Provided education regarding home safety and fall prevention. Encouraged to use assistive devices and crutches as recommended when ambulating. Encouraged to follow provider's recommendations regarding weight bearing to prevent falls and injury. Reports recent foot surgery. Reports  using crutches without difficulty. Denies recent falls. . Reviewed pending appointments. Encouraged to attend appointments as scheduled to prevent delays in care. Denies concerns regarding transportation. Reports pending follow-up with  Podiatry on 07/08/19. [name] Discussed plans for ongoing care management and follow up. Provided direct contact information for care management team   Patient Self Care Activities:  . Self administers medications  . Attends scheduled provider appointments . Calls pharmacy for medication refills . Performs ADL's independently . Performs IADL's independently   Initial goal documentation        [name] was given information about Chronic Care Management services including:  1. CCM service includes personalized support from designated clinical staff supervised by his physician, including individualized plan of care and coordination with other care providers 2. 24/7 contact phone numbers for assistance for urgent and routine care needs. 3. Service will only be billed when office clinical staff spend 20 minutes or more in a month to coordinate care. 4. Only one practitioner may furnish and bill the service in a calendar month. 5. The patient may stop CCM services at any time (effective at the end of the month) by phone call to the office staff. 6. The patient will be responsible for cost sharing (co-pay) of up to 20% of the service fee (after annual deductible is met).  Patient agreed to services and verbal consent obtained.    PLAN The care management team will follow up with [name] in the next three months.   [facility] Care Management [phone]

## 2019-07-08 DIAGNOSIS — M069 Rheumatoid arthritis, unspecified: Secondary | ICD-10-CM | POA: Diagnosis not present

## 2019-07-08 DIAGNOSIS — M79671 Pain in right foot: Secondary | ICD-10-CM | POA: Diagnosis not present

## 2019-07-15 DIAGNOSIS — M79671 Pain in right foot: Secondary | ICD-10-CM | POA: Diagnosis not present

## 2019-07-15 DIAGNOSIS — M069 Rheumatoid arthritis, unspecified: Secondary | ICD-10-CM | POA: Diagnosis not present

## 2019-07-15 DIAGNOSIS — Z09 Encounter for follow-up examination after completed treatment for conditions other than malignant neoplasm: Secondary | ICD-10-CM | POA: Diagnosis not present

## 2019-07-15 DIAGNOSIS — M19071 Primary osteoarthritis, right ankle and foot: Secondary | ICD-10-CM | POA: Diagnosis not present

## 2019-07-29 DIAGNOSIS — Z09 Encounter for follow-up examination after completed treatment for conditions other than malignant neoplasm: Secondary | ICD-10-CM | POA: Diagnosis not present

## 2019-07-29 DIAGNOSIS — M19071 Primary osteoarthritis, right ankle and foot: Secondary | ICD-10-CM | POA: Diagnosis not present

## 2019-07-29 DIAGNOSIS — M205X1 Other deformities of toe(s) (acquired), right foot: Secondary | ICD-10-CM | POA: Diagnosis not present

## 2019-07-29 DIAGNOSIS — M2041 Other hammer toe(s) (acquired), right foot: Secondary | ICD-10-CM | POA: Diagnosis not present

## 2019-08-05 DIAGNOSIS — M19071 Primary osteoarthritis, right ankle and foot: Secondary | ICD-10-CM | POA: Diagnosis not present

## 2019-08-05 DIAGNOSIS — Z09 Encounter for follow-up examination after completed treatment for conditions other than malignant neoplasm: Secondary | ICD-10-CM | POA: Diagnosis not present

## 2019-08-05 DIAGNOSIS — M205X1 Other deformities of toe(s) (acquired), right foot: Secondary | ICD-10-CM | POA: Diagnosis not present

## 2019-08-05 DIAGNOSIS — M2041 Other hammer toe(s) (acquired), right foot: Secondary | ICD-10-CM | POA: Diagnosis not present

## 2019-08-05 DIAGNOSIS — M2021 Hallux rigidus, right foot: Secondary | ICD-10-CM | POA: Diagnosis not present

## 2019-08-12 DIAGNOSIS — Z09 Encounter for follow-up examination after completed treatment for conditions other than malignant neoplasm: Secondary | ICD-10-CM | POA: Diagnosis not present

## 2019-08-23 DIAGNOSIS — E785 Hyperlipidemia, unspecified: Secondary | ICD-10-CM | POA: Diagnosis not present

## 2019-08-23 DIAGNOSIS — Z4789 Encounter for other orthopedic aftercare: Secondary | ICD-10-CM | POA: Diagnosis not present

## 2019-08-23 DIAGNOSIS — Z9181 History of falling: Secondary | ICD-10-CM | POA: Diagnosis not present

## 2019-08-23 DIAGNOSIS — I10 Essential (primary) hypertension: Secondary | ICD-10-CM | POA: Diagnosis not present

## 2019-08-23 DIAGNOSIS — M06071 Rheumatoid arthritis without rheumatoid factor, right ankle and foot: Secondary | ICD-10-CM | POA: Diagnosis not present

## 2019-08-28 DIAGNOSIS — Z9181 History of falling: Secondary | ICD-10-CM | POA: Diagnosis not present

## 2019-08-28 DIAGNOSIS — M06071 Rheumatoid arthritis without rheumatoid factor, right ankle and foot: Secondary | ICD-10-CM | POA: Diagnosis not present

## 2019-08-28 DIAGNOSIS — I10 Essential (primary) hypertension: Secondary | ICD-10-CM | POA: Diagnosis not present

## 2019-08-28 DIAGNOSIS — E785 Hyperlipidemia, unspecified: Secondary | ICD-10-CM | POA: Diagnosis not present

## 2019-08-28 DIAGNOSIS — Z4789 Encounter for other orthopedic aftercare: Secondary | ICD-10-CM | POA: Diagnosis not present

## 2019-09-02 DIAGNOSIS — M069 Rheumatoid arthritis, unspecified: Secondary | ICD-10-CM | POA: Diagnosis not present

## 2019-09-04 ENCOUNTER — Telehealth: Payer: Self-pay

## 2019-09-04 DIAGNOSIS — I10 Essential (primary) hypertension: Secondary | ICD-10-CM | POA: Diagnosis not present

## 2019-09-04 DIAGNOSIS — M06071 Rheumatoid arthritis without rheumatoid factor, right ankle and foot: Secondary | ICD-10-CM | POA: Diagnosis not present

## 2019-09-04 DIAGNOSIS — Z9181 History of falling: Secondary | ICD-10-CM | POA: Diagnosis not present

## 2019-09-04 DIAGNOSIS — Z4789 Encounter for other orthopedic aftercare: Secondary | ICD-10-CM | POA: Diagnosis not present

## 2019-09-04 DIAGNOSIS — E785 Hyperlipidemia, unspecified: Secondary | ICD-10-CM | POA: Diagnosis not present

## 2019-09-06 DIAGNOSIS — E785 Hyperlipidemia, unspecified: Secondary | ICD-10-CM | POA: Diagnosis not present

## 2019-09-06 DIAGNOSIS — Z9181 History of falling: Secondary | ICD-10-CM | POA: Diagnosis not present

## 2019-09-06 DIAGNOSIS — I10 Essential (primary) hypertension: Secondary | ICD-10-CM | POA: Diagnosis not present

## 2019-09-06 DIAGNOSIS — M06071 Rheumatoid arthritis without rheumatoid factor, right ankle and foot: Secondary | ICD-10-CM | POA: Diagnosis not present

## 2019-09-06 DIAGNOSIS — Z4789 Encounter for other orthopedic aftercare: Secondary | ICD-10-CM | POA: Diagnosis not present

## 2019-10-01 ENCOUNTER — Ambulatory Visit (INDEPENDENT_AMBULATORY_CARE_PROVIDER_SITE_OTHER): Payer: Medicare HMO

## 2019-10-01 VITALS — Ht 72.0 in | Wt 210.0 lb

## 2019-10-01 DIAGNOSIS — Z0000 Encounter for general adult medical examination without abnormal findings: Secondary | ICD-10-CM | POA: Diagnosis not present

## 2019-10-01 DIAGNOSIS — Z1211 Encounter for screening for malignant neoplasm of colon: Secondary | ICD-10-CM | POA: Diagnosis not present

## 2019-10-01 NOTE — Patient Instructions (Signed)
[name] , Thank you for taking time to come for your Medicare Wellness Visit. I appreciate your ongoing commitment to your health goals. Please review the following plan we discussed and let me know if I can assist you in the future.   Screening recommendations/referrals: Colonoscopy: done 12/09/15. Referral sent today for repeat screening colonoscopy.  Recommended yearly ophthalmology/optometry visit for glaucoma screening and checkup Recommended yearly dental visit for hygiene and checkup  Vaccinations: Influenza vaccine: done 04/10/19 Pneumococcal vaccine: done 09/22/15 Tdap vaccine: done 06/23/16 Shingles vaccine: Shingrix discussed. Please contact your pharmacy for coverage information.   Advanced directives: Advance directive discussed with you today. Even though you declined this today please call our office should you change your mind and we can give you the proper paperwork for you to fill out.  Conditions/risks identified:   Free hearing clinics offered in [place]:   [facility], [place], [place] [address] [phone]  [facility], [place], [place] [address] [phone]   Next appointment: Please follow up in one year for your Medicare Annual Wellness visit.    Preventive Care 40-64 Years, Male Preventive care refers to lifestyle choices and visits with your health care provider that can promote health and wellness. What does preventive care include?  A yearly physical exam. This is also called an annual well check.  Dental exams once or twice a year.  Routine eye exams. Ask your health care provider how often you should have your eyes checked.  Personal lifestyle choices, including:  Daily care of your teeth and gums.  Regular physical activity.  Eating a healthy diet.  Avoiding tobacco and drug use.  Limiting alcohol use.  Practicing safe sex.  Taking low-dose aspirin every day  starting at age 43. What happens during an annual well check? The services and screenings done by your health care provider during your annual well check will depend on your age, overall health, lifestyle risk factors, and family history of disease. Counseling  Your health care provider may ask you questions about your:  Alcohol use.  Tobacco use.  Drug use.  Emotional well-being.  Home and relationship well-being.  Sexual activity.  Eating habits.  Work and work Statistician. Screening  You may have the following tests or measurements:  Height, weight, and BMI.  Blood pressure.  Lipid and cholesterol levels. These may be checked every 5 years, or more frequently if you are over 34 years old.  Skin check.  Lung cancer screening. You may have this screening every year starting at age 91 if you have a 30-pack-year history of smoking and currently smoke or have quit within the past 15 years.  Fecal occult blood test (FOBT) of the stool. You may have this test every year starting at age 18.  Flexible sigmoidoscopy or colonoscopy. You may have a sigmoidoscopy every 5 years or a colonoscopy every 10 years starting at age 79.  Prostate cancer screening. Recommendations will vary depending on your family history and other risks.  Hepatitis C blood test.  Hepatitis B blood test.  Sexually transmitted disease (STD) testing.  Diabetes screening. This is done by checking your blood sugar (glucose) after you have not eaten for a while (fasting). You may have this done every 1-3 years. Discuss your test results, treatment options, and if necessary, the need for more tests with your health care provider. Vaccines  Your health care provider may recommend certain vaccines, such as:  Influenza vaccine. This is recommended every year.  Tetanus, diphtheria, and acellular pertussis (Tdap, Td) vaccine. You may need a Td booster every 10 years.  Zoster vaccine. You may need this after  age 17.  Pneumococcal 13-valent conjugate (PCV13) vaccine. You may need this if you have certain conditions and have not been vaccinated.  Pneumococcal polysaccharide (PPSV23) vaccine. You may need one or two doses if you smoke cigarettes or if you have certain conditions. Talk to your health care provider about which screenings and vaccines you need and how often you need them. This information is not intended to replace advice given to you by your health care provider. Make sure you discuss any questions you have with your health care provider. Document Released: 07/10/2015 Document Revised: 03/02/2016 Document Reviewed: 04/14/2015 Elsevier Interactive Patient Education  2017 [place] Prevention in the Home Falls can cause injuries. They can happen to people of all ages. There are many things you can do to make your home safe and to help prevent falls. What can I do on the outside of my home?  Regularly fix the edges of walkways and driveways and fix any cracks.  Remove anything that might make you trip as you walk through a door, such as a raised step or threshold.  Trim any bushes or trees on the path to your home.  Use bright outdoor lighting.  Clear any walking paths of anything that might make someone trip, such as rocks or tools.  Regularly check to see if handrails are loose or broken. Make sure that both sides of any steps have handrails.  Any raised decks and porches should have guardrails on the edges.  Have any leaves, snow, or ice cleared regularly.  Use sand or salt on walking paths during winter.  Clean up any spills in your garage right away. This includes oil or grease spills. What can I do in the bathroom?  Use night lights.  Install grab bars by the toilet and in the tub and shower. Do not use towel bars as grab bars.  Use non-skid mats or decals in the tub or shower.  If you need to sit down in the shower, use a plastic, non-slip  stool.  Keep the floor dry. Clean up any water that spills on the floor as soon as it happens.  Remove soap buildup in the tub or shower regularly.  Attach bath mats securely with double-sided non-slip rug tape.  Do not have throw rugs and other things on the floor that can make you trip. What can I do in the bedroom?  Use night lights.  Make sure that you have a light by your bed that is easy to reach.  Do not use any sheets or blankets that are too big for your bed. They should not hang down onto the floor.  Have a firm chair that has side arms. You can use this for support while you get dressed.  Do not have throw rugs and other things on the floor that can make you trip. What can I do in the kitchen?  Clean up any spills right away.  Avoid walking on wet floors.  Keep items that you use a lot in easy-to-reach places.  If you need to reach something above you, use a strong step stool that has a grab bar.  Keep electrical cords out of the way.  Do not use floor polish or wax that makes floors slippery. If you must use wax,  use non-skid floor wax.  Do not have throw rugs and other things on the floor that can make you trip. What can I do with my stairs?  Do not leave any items on the stairs.  Make sure that there are handrails on both sides of the stairs and use them. Fix handrails that are broken or loose. Make sure that handrails are as long as the stairways.  Check any carpeting to make sure that it is firmly attached to the stairs. Fix any carpet that is loose or worn.  Avoid having throw rugs at the top or bottom of the stairs. If you do have throw rugs, attach them to the floor with carpet tape.  Make sure that you have a light switch at the top of the stairs and the bottom of the stairs. If you do not have them, ask someone to add them for you. What else can I do to help prevent falls?  Wear shoes that:  Do not have high heels.  Have rubber bottoms.  Are  comfortable and fit you well.  Are closed at the toe. Do not wear sandals.  If you use a stepladder:  Make sure that it is fully opened. Do not climb a closed stepladder.  Make sure that both sides of the stepladder are locked into place.  Ask someone to hold it for you, if possible.  Clearly mark and make sure that you can see:  Any grab bars or handrails.  First and last steps.  Where the edge of each step is.  Use tools that help you move around (mobility aids) if they are needed. These include:  Canes.  Walkers.  Scooters.  Crutches.  Turn on the lights when you go into a dark area. Replace any light bulbs as soon as they burn out.  Set up your furniture so you have a clear path. Avoid moving your furniture around.  If any of your floors are uneven, fix them.  If there are any pets around you, be aware of where they are.  Review your medicines with your doctor. Some medicines can make you feel dizzy. This can increase your chance of falling. Ask your doctor what other things that you can do to help prevent falls. This information is not intended to replace advice given to you by your health care provider. Make sure you discuss any questions you have with your health care provider. Document Released: 04/09/2009 Document Revised: 11/19/2015 Document Reviewed: 07/18/2014 Elsevier Interactive Patient Education  2017 [employer].
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
## 2019-10-08 ENCOUNTER — Other Ambulatory Visit: Payer: Self-pay

## 2019-10-08 ENCOUNTER — Ambulatory Visit (INDEPENDENT_AMBULATORY_CARE_PROVIDER_SITE_OTHER): Payer: Medicare HMO | Admitting: Family Medicine

## 2019-10-08 ENCOUNTER — Encounter: Payer: Self-pay | Admitting: Family Medicine

## 2019-10-08 VITALS — BP 122/82 | HR 74 | Temp 98.3°F | Resp 14 | Ht 73.0 in | Wt 208.0 lb

## 2019-10-08 DIAGNOSIS — L03011 Cellulitis of right finger: Secondary | ICD-10-CM | POA: Diagnosis not present

## 2019-10-08 NOTE — Patient Instructions (Addendum)
Hibiclens - ask your pharmacist to help you find - do warm water and hibiclens soaks 2-3 times a day for the next 5-7 days, apply bandage without any ointment Take the antibiotics - with food and probiotic, mild stomach upset or diarrhea is common  Follow up with the hand specialist  If you have rapidly spreading swelling, pain, redness and you have a delay in seeing the hand specialist do go to the ER for evaluation - they have hand specialists on-call.        Cellulitis, Adult  Cellulitis is a skin infection. The infected area is often warm, red, swollen, and sore. It occurs most often in the arms and lower legs. It is very important to get treated for this condition. What are the causes? This condition is caused by bacteria. The bacteria enter through a break in the skin, such as a cut, burn, insect bite, open sore, or crack. What increases the risk? This condition is more likely to occur in people who:  Have a weak body defense system (immune system).  Have open cuts, burns, bites, or scrapes on the skin.  Are older than 59 years of age.  Have a blood sugar problem (diabetes).  Have a long-lasting (chronic) liver disease (cirrhosis) or kidney disease.  Are very overweight (obese).  Have a skin problem, such as: ? Itchy rash (eczema). ? Slow movement of blood in the veins (venous stasis). ? Fluid buildup below the skin (edema).  Have been treated with high-energy rays (radiation).  Use IV drugs. What are the signs or symptoms? Symptoms of this condition include:  Skin that is: ? Red. ? Streaking. ? Spotting. ? Swollen. ? Sore or painful when you touch it. ? Warm.  A fever.  Chills.  Blisters. How is this diagnosed? This condition is diagnosed based on:  Medical history.  Physical exam.  Blood tests.  Imaging tests. How is this treated? Treatment for this condition may include:  Medicines to treat infections or allergies.  Home care, such  as: ? Rest. ? Placing cold or warm cloths (compresses) on the skin.  Hospital care, if the condition is very bad. Follow these instructions at home: Medicines  Take over-the-counter and prescription medicines only as told by your doctor.  If you were prescribed an antibiotic medicine, take it as told by your doctor. Do not stop taking it even if you start to feel better. General instructions   Drink enough fluid to keep your pee (urine) pale yellow.  Do not touch or rub the infected area.  Raise (elevate) the infected area above the level of your heart while you are sitting or lying down.  Place cold or warm cloths on the area as told by your doctor.  Keep all follow-up visits as told by your doctor. This is important. Contact a doctor if:  You have a fever.  You do not start to get better after 1-2 days of treatment.  Your bone or joint under the infected area starts to hurt after the skin has healed.  Your infection comes back. This can happen in the same area or another area.  You have a swollen bump in the area.  You have new symptoms.  You feel ill and have muscle aches and pains. Get help right away if:  Your symptoms get worse.  You feel very sleepy.  You throw up (vomit) or have watery poop (diarrhea) for a long time.  You see red streaks coming from the area.  Your red area gets larger.  Your red area turns dark in color. These symptoms may represent a serious problem that is an emergency. Do not wait to see if the symptoms will go away. Get medical help right away. Call your local emergency services (911 in the [place] Do not drive yourself to the hospital. Summary  Cellulitis is a skin infection. The area is often warm, red, swollen, and sore.  This condition is treated with medicines, rest, and cold and warm cloths.  Take all medicines only as told by your doctor.  Tell your doctor if symptoms do not start to get better after 1-2 days of  treatment. This information is not intended to replace advice given to you by your health care provider. Make sure you discuss any questions you have with your health care provider. Document Revised: 11/02/2017 Document Reviewed: 11/02/2017 Elsevier Patient Education  [place].

## 2019-10-08 NOTE — Progress Notes (Signed)
Patient ID: Franklin Richardson, male    DOB: Feb 25, 1961, 59 y.o.   MRN: [id number]  PCP: Delsa Grana, PA-C  Chief Complaint  Patient presents with  . Wound Infection    right pointer finger, swollen, red and sore    Subjective:   Franklin Richardson is a 59 y.o. male, presents to clinic with CC of the following:  HPI  Here with at least one week of right index finger PIP joint infection on dorsal aspect.  He is right hand dominant, also ambidextrous Mechanic has scratches and scabbs to bother hands and figers, no specific injury.  Denies any contusion, injury, foreign body.  It has greadually worsened over the past week.  He reports only a little pain, no purulent drainage, feels like its starting to swell around the whole finger/bottom of finger  He's treated with antibiotic ointment, bandaging, and hydrogen peroxide soaks     Patient Active Problem List   Diagnosis Date Noted  . TBI (traumatic brain injury) ([place]) 08/31/2017  . 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 Allergic rhinitis, seasonal 04/07/2015  . Hypertension 02/05/2015  . Hyperlipidemia 02/05/2015      Current Outpatient Medications:  .  Adalimumab (HUMIRA PEN) 40 MG/0.4ML PNKT, Inject into the skin. Inject once every 2 weeks, Disp: , Rfl:  .  amLODipine (NORVASC) 10 MG tablet, Take 1 tablet (10 mg total) by mouth daily., Disp: 90 tablet, Rfl: 1 .  atenolol (TENORMIN) 50 MG tablet, Take 1 tablet (50 mg total) by mouth daily., Disp: 90 tablet, Rfl: 1 .  atorvastatin (LIPITOR) 20 MG tablet, TAKE 1 TABLET AT BEDTIME (NEW DOSE), Disp: 90 tablet, Rfl: 0 .  hydrochlorothiazide (HYDRODIURIL) 12.5 MG tablet, Take 1 tablet (12.5 mg total) by mouth daily., Disp: 90 tablet, Rfl: 1 .  spironolactone (ALDACTONE) 25 MG tablet, Take 1 tablet (25 mg total) by mouth 2 (two) times daily., Disp: 180 tablet, Rfl: 1   Allergies  Allergen Reactions  . Ace Inhibitors Shortness Of Breath and Swelling  . Quinapril Swelling    Other  reaction(s): SWELLING/EDEMA     Family History  Problem Relation Age of Onset  . Heart attack Mother   . Brain cancer Father   . Heart attack Brother   . Stroke Maternal Grandmother   . Stroke Maternal Grandfather      Social History   Socioeconomic History  . Marital status: Married    Spouse name: Rose  . Number of children: 3  . Years of education: Not on file  . Highest education level: 9th grade  Occupational History  . Occupation: disabled  Tobacco Use  . Smoking status: Never Smoker  . Smokeless tobacco: Never Used  . Tobacco comment: smoking cessation materials not required  Substance and Sexual Activity  . Alcohol use: Not Currently  . Drug use: No  . Sexual activity: Yes    Partners: Female    Birth control/protection: None  Other Topics Concern  . Not on file  Social History Narrative  . Not on file   Social Determinants of Health   Financial Resource Strain: Low Risk   . Difficulty of Paying Living Expenses: Not hard at all  Food Insecurity: No Food Insecurity  . Worried About Charity fundraiser in the Last Year: Never true  . Ran Out of Food in the Last Year: Never true  Transportation Needs: No Transportation Needs  . Lack of Transportation (Medical): No  . Lack of Transportation (Non-Medical): No  Physical Activity: Inactive  . Days of Exercise per Week: 0 days  . Minutes of Exercise per Session: 0 min  Stress: No Stress Concern Present  . Feeling of Stress : Not at all  Social Connections: Somewhat Isolated  . Frequency of Communication with Friends and Family: More than three times a week  . Frequency of Social Gatherings with Friends and Family: Once a week  . Attends Religious Services: Never  . Active Member of Clubs or Organizations: No  . Attends Archivist Meetings: Never  . Marital Status: Married  Human resources officer Violence: Not At Risk  . Fear of Current or Ex-Partner: No  . Emotionally Abused: No  . Physically  Abused: No  . Sexually Abused: No    Chart Review Today: I personally reviewed active problem list, medication list, allergies, family history, social history, health maintenance, notes from last encounter, lab results, imaging with the patient/caregiver today.   Review of Systems 10 Systems reviewed and are negative for acute change except as noted in the HPI.     Objective:   Vitals:   10/08/19 1119  BP: 122/82  Pulse: 74  Resp: 14  Temp: 98.3 F (36.8 C)  SpO2: 94%  Weight: 208 lb (94.3 kg)  Height: 6\' 1"  (1.854 m)    Body mass index is 27.44 kg/m.  Physical Exam Vitals and nursing note reviewed.  Constitutional:      General: He is not in acute distress. Cardiovascular:     Rate and Rhythm: Normal rate.     Pulses: Normal pulses.  Pulmonary:     Effort: Pulmonary effort is normal.     Breath sounds: Normal breath sounds.  Musculoskeletal:     Comments: Right 2nd pip joint edematous and erythematous with roughly 100mm circular wound, mildly ttp, no purulent dischart, no induration, decreased pip flexion Scattered other abrasions and scratches to b/l hands/fingers/arms  Skin:    Capillary Refill: Capillary refill takes less than 2 seconds.  Neurological:     Mental Status: He is alert.  Psychiatric:        Mood and Affect: Mood normal.        Behavior: Behavior normal.            Results for orders placed or performed during the hospital encounter of 07/01/19  SARS CORONAVIRUS 2 (TAT 6-24 HRS) Nasopharyngeal Nasopharyngeal Swab   Specimen: Nasopharyngeal Swab  Result Value Ref Range   SARS Coronavirus 2 NEGATIVE NEGATIVE        Assessment & Plan:      ICD-10-CM   1. Cellulitis of right finger  L03.011 Ambulatory referral to Orthopedics   Pt take take oral abx, soak in warm water with hibiclens 2-3 x a day - keep area covered, because of location of infection and possibility of rapidly worsening deep space infection will have him follow-up with  Ortho. I explained to him that he should be able to see a decrease in the erythema within 24 to 48 hours if antibiotics are working, if he has any rapid spreading redness edema pain that is tracking up his arm and he was not seeing orthopedics then he needs to go to the ER for evaluation.     Delsa Grana, PA-C 10/08/19 11:45 AM

## 2019-10-09 MED ORDER — SULFAMETHOXAZOLE-TRIMETHOPRIM 800-160 MG PO TABS: 1.0000 | ORAL_TABLET | Freq: Two times a day (BID) | ORAL | 0 refills | Status: AC

## 2019-10-11 ENCOUNTER — Encounter: Payer: Medicare HMO | Admitting: Family Medicine

## 2019-10-21 NOTE — Patient Instructions (Addendum)
Thank you for allowing the Chronic Care Management team to participate in your care.  Goals Addressed            This Visit's Progress   . Chronic Disease Management       Current Barriers:  . Chronic Disease Management support and education needs related to hypertension and hyperlipidemia.  Case Manager Clinical Goal(s):  [name] Over the next 90 days, patient will take all medications as prescribed. . Over the next 90 days, patient will monitor blood pressure and record readings. . Over the next 90 days, patient will adhere to recommended cardiac prudent/heart healthy diet. . Over the next 90 days, patient will follow recommended safety and fall prevention measures. . Over the next 90 days patient will attend all medical appointments as scheduled.  Interventions:  . Reviewed medications. Encouraged to take medications as prescribed and notify care management team with concerns regarding prescription costs. Reports self administering medications. Denies current concerns regarding medication management or prescription costs. . Provided education regarding established blood pressure parameters and indications for notifying provider. Encouraged to monitor at least three times a week if unable to monitor daily. Encouraged to record readings to identify trends. Reports not currently monitoring BP at home. . Discussed current nutritional intake and dietary options. Encouraged to comply with recommended cardiac prudent diet. . Provided education regarding home safety and fall prevention. Encouraged to use assistive devices and crutches as recommended when ambulating. Encouraged to follow provider's recommendations regarding weight bearing to prevent falls and injury. Reports recent foot surgery. Reports using crutches without difficulty. Denies recent falls. . Reviewed pending appointments. Encouraged to attend appointments as scheduled to prevent delays in care. Denies concerns regarding transportation.  Reports pending follow-up with Podiatry on 07/08/19. [name] Discussed plans for ongoing care management and follow up. Provided direct contact information for care management team   Patient Self Care Activities:  . Self administers medications  . Attends scheduled provider appointments . Calls pharmacy for medication refills . Performs ADL's independently . Performs IADL's independently   Initial goal documentation        Mr. Garringer was given information about Chronic Care Management services including:  1. CCM service includes personalized support from designated clinical staff supervised by his physician, including individualized plan of care and coordination with other care providers 2. 24/7 contact phone numbers for assistance for urgent and routine care needs. 3. Service will only be billed when office clinical staff spend 20 minutes or more in a month to coordinate care. 4. Only one practitioner may furnish and bill the service in a calendar month. 5. The patient may stop CCM services at any time (effective at the end of the month) by phone call to the office staff. 6. The patient will be responsible for cost sharing (co-pay) of up to 20% of the service fee (after annual deductible is met).  Patient agreed to services. Verbal consent obtained.    Mr. Corlew verbalized understanding of the instructions provided during the telephonic outreach today. Declined need for a mailed/printed copy of the instructions.   The care management team will follow-up with Mr. Tamblyn in three months.    [facility] [phone]

## 2019-10-22 ENCOUNTER — Encounter: Payer: Medicare HMO | Admitting: Family Medicine

## 2019-10-23 ENCOUNTER — Telehealth: Payer: Self-pay

## 2019-10-25 ENCOUNTER — Ambulatory Visit: Payer: Self-pay

## 2019-10-25 DIAGNOSIS — L03011 Cellulitis of right finger: Secondary | ICD-10-CM

## 2019-10-25 DIAGNOSIS — I10 Essential (primary) hypertension: Secondary | ICD-10-CM

## 2019-10-25 NOTE — Chronic Care Management (AMB) (Signed)
Chronic Care Management   Follow Up Note   10/25/2019 Name: Franklin Richardson MRN: [id number] DOB: 04/06/1961  Primary Care Provider: Delsa Grana, PA-C Reason for referral : Chronic Care Management   Franklin Richardson is a 59 y.o. year old male who is a primary care patient of Delsa Grana, [place]. He is currently engaged with the chronic care management team. A routine outreach was conducted today.  Review of Franklin Richardson status, including review of consultants reports, relevant labs and test results was conducted today. Collaboration with appropriate care team members was performed as part of the comprehensive evaluation and provision of chronic care management services.     SDOH (Social Determinants of Health) assessments performed: No See Care Plan activities for detailed interventions related to [facility])     Outpatient Encounter Medications as of 10/25/2019  Medication Sig  . Adalimumab (HUMIRA PEN) 40 MG/0.4ML PNKT Inject into the skin. Inject once every 2 weeks  . amLODipine (NORVASC) 10 MG tablet Take 1 tablet (10 mg total) by mouth daily.  [name] atenolol (TENORMIN) 50 MG tablet Take 1 tablet (50 mg total) by mouth daily.  [name] atorvastatin (LIPITOR) 20 MG tablet TAKE 1 TABLET AT BEDTIME (NEW DOSE)  . hydrochlorothiazide (HYDRODIURIL) 12.5 MG tablet Take 1 tablet (12.5 mg total) by mouth daily.  [name] spironolactone (ALDACTONE) 25 MG tablet Take 1 tablet (25 mg total) by mouth 2 (two) times daily.   No facility-administered encounter medications on file as of 10/25/2019.     Objective:  BP Readings from Last 3 Encounters:  10/08/19 122/82  07/02/19 129/76  06/14/19 132/80     Goals Addressed            This Visit's Progress   . Chronic Disease Management       Current Barriers:  . Chronic Disease Management support and education needs related to hypertension and hyperlipidemia.  Case Manager Clinical Goal(s):  [name] Over the next 90 days, patient will take all medications as  prescribed. . Over the next 90 days, patient will monitor blood pressure and record readings. . Over the next 90 days, patient will adhere to recommended cardiac prudent/heart healthy diet. . Over the next 90 days, patient will follow recommended safety and fall prevention measures. . Over the next 90 days patient will attend all medical appointments as scheduled.  Interventions:  . Reviewed medications. Pending refills via [facility] mail order. Denies concerns regarding medication management or prescription costs. . Discussed compliance with blood pressure monitoring. Reports not monitoring at home d/t multiple clinic visits. Reports clinical readings have been within range.  . Discussed s/sx of infection and indications for notifying provider. Recently diagnosed with cellulitis to the right index finger. Reports the site appears to have healed well. Denies edema, redness, drainage or discomfort to the site. Aware of indications for seeking immediate medical attention. . Reviewed pending appointments. Encouraged to attend appointments as scheduled to prevent delays in care. Pending PCP follow-up on 5/19. Pending outreach with Rheumatology on 11/19/19.     Patient Self Care Activities:  . Self administers medications  . Attends scheduled provider appointments . Calls pharmacy for medication refills . Performs ADL's independently . Performs IADL's independently   Please see past updates related to this goal by clicking on the "Past Updates" button in the selected goal          PLAN The care management team will follow up with Franklin Richardson next month.   [facility] Care  Management [phone]

## 2019-10-25 NOTE — Patient Instructions (Signed)
Thank you for allowing the Chronic Care Management team to participate in your care.  Goals Addressed            This Visit's Progress   . Chronic Disease Management       Current Barriers:  . Chronic Disease Management support and education needs related to hypertension and hyperlipidemia.  Case Manager Clinical Goal(s):  [name] Over the next 90 days, patient will take all medications as prescribed. . Over the next 90 days, patient will monitor blood pressure and record readings. . Over the next 90 days, patient will adhere to recommended cardiac prudent/heart healthy diet. . Over the next 90 days, patient will follow recommended safety and fall prevention measures. . Over the next 90 days patient will attend all medical appointments as scheduled.  Interventions:  . Reviewed medications. Pending refills via [facility] mail order. Denies concerns regarding medication management or prescription costs. . Discussed compliance with blood pressure monitoring. Reports not monitoring at home d/t multiple clinic visits. Reports clinical readings have been within range.  . Discussed s/sx of infection and indications for notifying provider. Recently diagnosed with cellulitis to the right index finger. Reports the site appears to have healed well. Denies edema, redness, drainage or discomfort to the site. Aware of indications for seeking immediate medical attention. . Reviewed pending appointments. Encouraged to attend appointments as scheduled to prevent delays in care. Pending PCP follow-up on 5/19. Pending outreach with Rheumatology on 11/19/19.     Patient Self Care Activities:  . Self administers medications  . Attends scheduled provider appointments . Calls pharmacy for medication refills . Performs ADL's independently . Performs IADL's independently   Please see past updates related to this goal by clicking on the "Past Updates" button in the selected goal         [name] verbalized  understanding of the instructions provided during the telephonic outreach today. Declined need for additional mailed/printed resources.   The care management team will follow up with [name] next month.    [facility] Care [facility] [phone]

## 2019-11-13 ENCOUNTER — Other Ambulatory Visit: Payer: Self-pay

## 2019-11-13 ENCOUNTER — Encounter: Payer: Self-pay | Admitting: Family Medicine

## 2019-11-13 ENCOUNTER — Ambulatory Visit (INDEPENDENT_AMBULATORY_CARE_PROVIDER_SITE_OTHER): Payer: Medicare HMO | Admitting: Family Medicine

## 2019-11-13 VITALS — BP 118/80 | HR 53 | Temp 98.7°F | Resp 14 | Ht 73.0 in | Wt 206.1 lb

## 2019-11-13 DIAGNOSIS — R7303 Prediabetes: Secondary | ICD-10-CM | POA: Diagnosis not present

## 2019-11-13 DIAGNOSIS — Z79899 Other long term (current) drug therapy: Secondary | ICD-10-CM

## 2019-11-13 DIAGNOSIS — D84821 Immunodeficiency due to drugs: Secondary | ICD-10-CM | POA: Diagnosis not present

## 2019-11-13 DIAGNOSIS — Z1211 Encounter for screening for malignant neoplasm of colon: Secondary | ICD-10-CM | POA: Diagnosis not present

## 2019-11-13 DIAGNOSIS — R1310 Dysphagia, unspecified: Secondary | ICD-10-CM

## 2019-11-13 DIAGNOSIS — Z114 Encounter for screening for human immunodeficiency virus [HIV]: Secondary | ICD-10-CM

## 2019-11-13 DIAGNOSIS — I10 Essential (primary) hypertension: Secondary | ICD-10-CM

## 2019-11-13 DIAGNOSIS — Z0000 Encounter for general adult medical examination without abnormal findings: Secondary | ICD-10-CM

## 2019-11-13 DIAGNOSIS — E782 Mixed hyperlipidemia: Secondary | ICD-10-CM

## 2019-11-13 DIAGNOSIS — Z9889 Other specified postprocedural states: Secondary | ICD-10-CM

## 2019-11-13 DIAGNOSIS — M0230 Reiter's disease, unspecified site: Secondary | ICD-10-CM

## 2019-11-13 MED ORDER — SPIRONOLACTONE 25 MG PO TABS: 25.0000 mg | ORAL_TABLET | Freq: Two times a day (BID) | ORAL | 3 refills | Status: DC

## 2019-11-13 MED ORDER — ATENOLOL 25 MG PO TABS: 25.0000 mg | ORAL_TABLET | Freq: Every day | ORAL | 3 refills | Status: DC

## 2019-11-13 MED ORDER — HYDROCHLOROTHIAZIDE 12.5 MG PO TABS: 12.5000 mg | ORAL_TABLET | Freq: Every morning | ORAL | 3 refills | Status: DC

## 2019-11-13 MED ORDER — AMLODIPINE BESYLATE 10 MG PO TABS: 10.0000 mg | ORAL_TABLET | Freq: Every day | ORAL | 3 refills | Status: DC

## 2019-11-13 MED ORDER — ATORVASTATIN CALCIUM 20 MG PO TABS: 20.0000 mg | ORAL_TABLET | Freq: Every day | ORAL | 3 refills | Status: DC
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
## 2019-11-13 NOTE — Patient Instructions (Signed)
Dermatology should call you for an appointment  GI should call you for an appointment   Preventive Care 1-59 Years Old, Male Preventive care refers to lifestyle choices and visits with your health care provider that can promote health and wellness. This includes:  A yearly physical exam. This is also called an annual well check.  Regular dental and eye exams.  Immunizations.  Screening for certain conditions.  Healthy lifestyle choices, such as eating a healthy diet, getting regular exercise, not using drugs or products that contain nicotine and tobacco, and limiting alcohol use. What can I expect for my preventive care visit? Physical exam Your health care provider will check:  Height and weight. These may be used to calculate body mass index (BMI), which is a measurement that tells if you are at a healthy weight.  Heart rate and blood pressure.  Your skin for abnormal spots. Counseling Your health care provider may ask you questions about:  Alcohol, tobacco, and drug use.  Emotional well-being.  Home and relationship well-being.  Sexual activity.  Eating habits.  Work and work Statistician. What immunizations do I need?  Influenza (flu) vaccine  This is recommended every year. Tetanus, diphtheria, and pertussis (Tdap) vaccine  You may need a Td booster every 10 years. Varicella (chickenpox) vaccine  You may need this vaccine if you have not already been vaccinated. Zoster (shingles) vaccine  You may need this after age 107. Measles, mumps, and rubella (MMR) vaccine  You may need at least one dose of MMR if you were born in 1957 or later. You may also need a second dose. Pneumococcal conjugate (PCV13) vaccine  You may need this if you have certain conditions and were not previously vaccinated. Pneumococcal polysaccharide (PPSV23) vaccine  You may need one or two doses if you smoke cigarettes or if you have certain conditions. Meningococcal conjugate  (MenACWY) vaccine  You may need this if you have certain conditions. Hepatitis A vaccine  You may need this if you have certain conditions or if you travel or work in places where you may be exposed to hepatitis A. Hepatitis B vaccine  You may need this if you have certain conditions or if you travel or work in places where you may be exposed to hepatitis B. Haemophilus influenzae type b (Hib) vaccine  You may need this if you have certain risk factors. Human papillomavirus (HPV) vaccine  If recommended by your health care provider, you may need three doses over 6 months. You may receive vaccines as individual doses or as more than one vaccine together in one shot (combination vaccines). Talk with your health care provider about the risks and benefits of combination vaccines. What tests do I need? Blood tests  Lipid and cholesterol levels. These may be checked every 5 years, or more frequently if you are over 65 years old.  Hepatitis C test.  Hepatitis B test. Screening  Lung cancer screening. You may have this screening every year starting at age 24 if you have a 30-pack-year history of smoking and currently smoke or have quit within the past 15 years.  Prostate cancer screening. Recommendations will vary depending on your family history and other risks.  Colorectal cancer screening. All adults should have this screening starting at age 76 and continuing until age 75. Your health care provider may recommend screening at age 67 if you are at increased risk. You will have tests every 1-10 years, depending on your results and the type of screening test.  Diabetes screening. This is done by checking your blood sugar (glucose) after you have not eaten for a while (fasting). You may have this done every 1-3 years.  Sexually transmitted disease (STD) testing. Follow these instructions at home: Eating and drinking  Eat a diet that includes fresh fruits and vegetables, whole grains,  lean protein, and low-fat dairy products.  Take vitamin and mineral supplements as recommended by your health care provider.  Do not drink alcohol if your health care provider tells you not to drink.  If you drink alcohol: ? Limit how much you have to 0-2 drinks a day. ? Be aware of how much alcohol is in your drink. In the [place] one drink equals one 12 oz bottle of beer (355 mL), one 5 oz glass of wine (148 mL), or one 1 oz glass of hard liquor (44 mL). Lifestyle  Take daily care of your teeth and gums.  Stay active. Exercise for at least 30 minutes on 5 or more days each week.  Do not use any products that contain nicotine or tobacco, such as cigarettes, e-cigarettes, and chewing tobacco. If you need help quitting, ask your health care provider.  If you are sexually active, practice safe sex. Use a condom or other form of protection to prevent STIs (sexually transmitted infections).  Talk with your health care provider about taking a low-dose aspirin every day starting at age 95. What's next?  Go to your health care provider once a year for a well check visit.  Ask your health care provider how often you should have your eyes and teeth checked.  Stay up to date on all vaccines. This information is not intended to replace advice given to you by your health care provider. Make sure you discuss any questions you have with your health care provider. Document Revised: 06/07/2018 Document Reviewed: 06/07/2018 Elsevier Patient Education  2020 [employer].

## 2019-11-14 LAB — HEMOGLOBIN A1C
Hgb A1c MFr Bld: 5.3 % of total Hgb (ref <5.7)
Mean Plasma Glucose: 105 (calc)
eAG (mmol/L): 5.8 (calc)

## 2019-11-14 LAB — CBC WITH DIFFERENTIAL/PLATELET
Absolute Monocytes: 442 cells/uL (ref 200–950)
Basophils Absolute: 20 cells/uL (ref 0–200)
Basophils Relative: 0.3 %
Eosinophils Absolute: 211 cells/uL (ref 15–500)
Eosinophils Relative: 3.1 %
HCT: 40.9 % (ref 38.5–50.0)
Hemoglobin: 13.6 g/dL (ref 13.2–17.1)
Lymphs Abs: 2203 cells/uL (ref 850–3900)
MCH: 28.6 pg (ref 27.0–33.0)
MCHC: 33.3 g/dL (ref 32.0–36.0)
MCV: 86.1 fL (ref 80.0–100.0)
MPV: 10.4 fL (ref 7.5–12.5)
Monocytes Relative: 6.5 %
Neutro Abs: 3924 cells/uL (ref 1500–7800)
Neutrophils Relative %: 57.7 %
Platelets: 275 10*3/uL (ref 140–400)
RBC: 4.75 10*6/uL (ref 4.20–5.80)
RDW: 12.7 % (ref 11.0–15.0)
Total Lymphocyte: 32.4 %
WBC: 6.8 10*3/uL (ref 3.8–10.8)

## 2019-11-14 LAB — COMPLETE METABOLIC PANEL WITH GFR
AG Ratio: 1.3 (calc) (ref 1.0–2.5)
ALT: 17 U/L (ref 9–46)
AST: 13 U/L (ref 10–35)
Albumin: 4.2 g/dL (ref 3.6–5.1)
Alkaline phosphatase (APISO): 121 U/L (ref 35–144)
BUN: 10 mg/dL (ref 7–25)
CO2: 29 mmol/L (ref 20–32)
Calcium: 9.3 mg/dL (ref 8.6–10.3)
Chloride: 103 mmol/L (ref 98–110)
Creat: 0.84 mg/dL (ref 0.70–1.33)
GFR, Est African American: 111 mL/min/{1.73_m2} (ref >=60)
GFR, Est Non African American: 96 mL/min/{1.73_m2} (ref >=60)
Globulin: 3.2 g/dL (calc) (ref 1.9–3.7)
Glucose, Bld: 95 mg/dL (ref 65–99)
Potassium: 4.8 mmol/L (ref 3.5–5.3)
Sodium: 137 mmol/L (ref 135–146)
Total Bilirubin: 0.5 mg/dL (ref 0.2–1.2)
Total Protein: 7.4 g/dL (ref 6.1–8.1)

## 2019-11-14 LAB — LIPID PANEL
Cholesterol: 187 mg/dL (ref <200)
HDL: 46 mg/dL (ref >=40)
LDL Cholesterol (Calc): 115 mg/dL (calc) — ABNORMAL HIGH
Non-HDL Cholesterol (Calc): 141 mg/dL (calc) — ABNORMAL HIGH (ref <130)
Total CHOL/HDL Ratio: 4.1 (calc) (ref <5.0)
Triglycerides: 146 mg/dL (ref <150)

## 2019-11-14 LAB — HIV ANTIBODY (ROUTINE TESTING W REFLEX): HIV 1&2 Ab, 4th Generation: NONREACTIVE

## 2019-11-18 ENCOUNTER — Ambulatory Visit: Payer: Medicare HMO

## 2019-11-20 ENCOUNTER — Other Ambulatory Visit: Payer: Self-pay

## 2019-11-20 ENCOUNTER — Ambulatory Visit: Payer: Self-pay

## 2019-11-20 ENCOUNTER — Telehealth: Payer: Self-pay

## 2019-11-20 ENCOUNTER — Ambulatory Visit: Payer: Medicare HMO | Admitting: Gastroenterology

## 2019-11-20 ENCOUNTER — Encounter: Payer: Self-pay | Admitting: Gastroenterology

## 2019-11-20 VITALS — BP 146/81 | HR 51 | Temp 97.5°F | Ht 73.0 in | Wt 208.1 lb

## 2019-11-20 DIAGNOSIS — R1319 Other dysphagia: Secondary | ICD-10-CM

## 2019-11-20 DIAGNOSIS — R1310 Dysphagia, unspecified: Secondary | ICD-10-CM

## 2019-11-20 DIAGNOSIS — Z86010 Personal history of colonic polyps: Secondary | ICD-10-CM

## 2019-11-20 MED ORDER — OMEPRAZOLE 40 MG PO CPDR
40.0000 mg | DELAYED_RELEASE_CAPSULE | Freq: Every day | ORAL | 0 refills | Status: DC
Start: 2019-11-20 — End: 2020-02-21

## 2019-11-20 MED ORDER — NA SULFATE-K SULFATE-MG SULF 17.5-3.13-1.6 GM/177ML PO SOLN: 354.0000 mL | Freq: Once | ORAL | 0 refills | Status: AC
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
## 2019-11-21 ENCOUNTER — Ambulatory Visit: Payer: Medicare HMO

## 2019-11-21 NOTE — Chronic Care Management (AMB) (Signed)
  Chronic Care Management   Outreach Note   Name: Franklin Richardson MRN: [id number] DOB: 08-17-1960  Primary Care Provider: Delsa Grana, PA-C Reason for referral : Chronic Care Management    An unsuccessful telephone outreach was attempted today. [name] was engaged with the chronic care management team.    Follow Up Plan: The care management team will reach out to [name] again within the next two to three weeks.    [facility] Care Management [phone]

## 2019-12-03 ENCOUNTER — Other Ambulatory Visit: Payer: Self-pay

## 2019-12-03 ENCOUNTER — Other Ambulatory Visit
Admission: RE | Admit: 2019-12-03 | Discharge: 2019-12-03 | Disposition: A | Discharge status: Home / Self Care | Payer: Medicare HMO | Source: Ambulatory Visit | Attending: Gastroenterology | Admitting: Gastroenterology

## 2019-12-03 DIAGNOSIS — Z01812 Encounter for preprocedural laboratory examination: Secondary | ICD-10-CM | POA: Insufficient documentation

## 2019-12-03 DIAGNOSIS — Z20822 Contact with and (suspected) exposure to covid-19: Secondary | ICD-10-CM | POA: Diagnosis not present

## 2019-12-03 LAB — SARS CORONAVIRUS 2 (TAT 6-24 HRS): SARS Coronavirus 2: NEGATIVE

## 2019-12-05 ENCOUNTER — Encounter: Payer: Self-pay | Admitting: Gastroenterology

## 2019-12-05 ENCOUNTER — Encounter
Admission: RE | Disposition: A | Discharge status: Home / Self Care | Payer: Self-pay | Source: Home / Self Care | Attending: Gastroenterology

## 2019-12-05 ENCOUNTER — Ambulatory Visit: Payer: Medicare HMO | Admitting: Certified Registered"

## 2019-12-05 ENCOUNTER — Ambulatory Visit
Admission: RE | Admit: 2019-12-05 | Discharge: 2019-12-05 | Disposition: A | Discharge status: Home / Self Care | Payer: Medicare HMO | Attending: Gastroenterology | Admitting: Gastroenterology

## 2019-12-05 DIAGNOSIS — R1310 Dysphagia, unspecified: Secondary | ICD-10-CM | POA: Diagnosis not present

## 2019-12-05 DIAGNOSIS — K760 Fatty (change of) liver, not elsewhere classified: Secondary | ICD-10-CM | POA: Diagnosis not present

## 2019-12-05 DIAGNOSIS — E119 Type 2 diabetes mellitus without complications: Secondary | ICD-10-CM | POA: Insufficient documentation

## 2019-12-05 DIAGNOSIS — M069 Rheumatoid arthritis, unspecified: Secondary | ICD-10-CM | POA: Insufficient documentation

## 2019-12-05 DIAGNOSIS — I10 Essential (primary) hypertension: Secondary | ICD-10-CM | POA: Diagnosis not present

## 2019-12-05 DIAGNOSIS — Z808 Family history of malignant neoplasm of other organs or systems: Secondary | ICD-10-CM | POA: Diagnosis not present

## 2019-12-05 DIAGNOSIS — D126 Benign neoplasm of colon, unspecified: Secondary | ICD-10-CM

## 2019-12-05 DIAGNOSIS — K635 Polyp of colon: Secondary | ICD-10-CM | POA: Diagnosis not present

## 2019-12-05 DIAGNOSIS — E785 Hyperlipidemia, unspecified: Secondary | ICD-10-CM | POA: Diagnosis not present

## 2019-12-05 DIAGNOSIS — Z981 Arthrodesis status: Secondary | ICD-10-CM | POA: Diagnosis not present

## 2019-12-05 DIAGNOSIS — D123 Benign neoplasm of transverse colon: Secondary | ICD-10-CM | POA: Diagnosis not present

## 2019-12-05 DIAGNOSIS — Z79899 Other long term (current) drug therapy: Secondary | ICD-10-CM | POA: Insufficient documentation

## 2019-12-05 DIAGNOSIS — Z8249 Family history of ischemic heart disease and other diseases of the circulatory system: Secondary | ICD-10-CM | POA: Diagnosis not present

## 2019-12-05 DIAGNOSIS — M0230 Reiter's disease, unspecified site: Secondary | ICD-10-CM | POA: Diagnosis not present

## 2019-12-05 DIAGNOSIS — Z1211 Encounter for screening for malignant neoplasm of colon: Secondary | ICD-10-CM | POA: Insufficient documentation

## 2019-12-05 DIAGNOSIS — K449 Diaphragmatic hernia without obstruction or gangrene: Secondary | ICD-10-CM | POA: Diagnosis not present

## 2019-12-05 DIAGNOSIS — Z9109 Other allergy status, other than to drugs and biological substances: Secondary | ICD-10-CM | POA: Diagnosis not present

## 2019-12-05 DIAGNOSIS — K222 Esophageal obstruction: Secondary | ICD-10-CM | POA: Insufficient documentation

## 2019-12-05 DIAGNOSIS — Z86010 Personal history of colonic polyps: Secondary | ICD-10-CM | POA: Diagnosis not present

## 2019-12-05 DIAGNOSIS — Z823 Family history of stroke: Secondary | ICD-10-CM | POA: Insufficient documentation

## 2019-12-05 DIAGNOSIS — R1319 Other dysphagia: Secondary | ICD-10-CM

## 2019-12-05 DIAGNOSIS — E1165 Type 2 diabetes mellitus with hyperglycemia: Secondary | ICD-10-CM | POA: Diagnosis not present

## 2019-12-05 DIAGNOSIS — Z9849 Cataract extraction status, unspecified eye: Secondary | ICD-10-CM | POA: Diagnosis not present

## 2019-12-05 HISTORY — DX: Type 2 diabetes mellitus without complications: E11.9

## 2019-12-05 HISTORY — PX: COLONOSCOPY WITH PROPOFOL: SHX5780

## 2019-12-05 HISTORY — PX: ESOPHAGOGASTRODUODENOSCOPY (EGD) WITH PROPOFOL: SHX5813

## 2019-12-05 SURGERY — COLONOSCOPY WITH PROPOFOL
Anesthesia: General

## 2019-12-05 MED ORDER — PROPOFOL 10 MG/ML IV BOLUS
INTRAVENOUS | Status: AC
  Filled 2019-12-05: qty 20

## 2019-12-05 MED ORDER — PROPOFOL 500 MG/50ML IV EMUL
INTRAVENOUS | Status: AC
  Filled 2019-12-05: qty 50

## 2019-12-05 MED ORDER — SODIUM CHLORIDE 0.9 % IV SOLN: INTRAVENOUS | Status: DC

## 2019-12-05 MED ORDER — GLYCOPYRROLATE 0.2 MG/ML IJ SOLN
INTRAMUSCULAR | Status: DC | PRN
  Administered 2019-12-05: .1 mg via INTRAVENOUS

## 2019-12-05 MED ORDER — PROPOFOL 10 MG/ML IV BOLUS
INTRAVENOUS | Status: DC | PRN
  Administered 2019-12-05: 30 mg via INTRAVENOUS
  Administered 2019-12-05: 50 mg via INTRAVENOUS
  Administered 2019-12-05 (×3): 20 mg via INTRAVENOUS

## 2019-12-05 MED ORDER — PROPOFOL 500 MG/50ML IV EMUL
INTRAVENOUS | Status: DC | PRN
  Administered 2019-12-05: 100 ug/kg/min via INTRAVENOUS

## 2019-12-05 MED ORDER — LIDOCAINE HCL (CARDIAC) PF 100 MG/5ML IV SOSY
PREFILLED_SYRINGE | INTRAVENOUS | Status: DC | PRN
  Administered 2019-12-05: 60 mg via INTRAVENOUS
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
## 2019-12-05 NOTE — Anesthesia Postprocedure Evaluation (Signed)
Anesthesia Post Note  Patient: Franklin Richardson  Procedure(s) Performed: COLONOSCOPY WITH PROPOFOL (N/A ) ESOPHAGOGASTRODUODENOSCOPY (EGD) WITH PROPOFOL (N/A )  Patient location during evaluation: Endoscopy Anesthesia Type: General Level of consciousness: awake and alert Pain management: pain level controlled Vital Signs Assessment: post-procedure vital signs reviewed and stable Respiratory status: spontaneous breathing, nonlabored ventilation, respiratory function stable and patient connected to nasal cannula oxygen Cardiovascular status: blood pressure returned to baseline and stable Postop Assessment: no apparent nausea or vomiting Anesthetic complications: no   No complications documented.   Last Vitals:  Vitals:   12/05/19 1010 12/05/19 1020  BP: 127/81 134/82  Pulse: (!) 58 (!) 50  Resp: 18 18  Temp:    SpO2: 98% (!) 86%    Last Pain:  Vitals:   12/05/19 0846  TempSrc: Temporal  PainSc: 0-No pain                 Precious Haws Josey Dettmann
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
## 2019-12-05 NOTE — Transfer of Care (Signed)
Immediate Anesthesia Transfer of Care Note  Patient: Franklin Richardson  Procedure(s) Performed: COLONOSCOPY WITH PROPOFOL (N/A ) ESOPHAGOGASTRODUODENOSCOPY (EGD) WITH PROPOFOL (N/A )  Patient Location: PACU and Endoscopy Unit  Anesthesia Type:General  Level of Consciousness: awake and drowsy  Airway & Oxygen Therapy: Patient Spontanous Breathing  Post-op Assessment: Report given to RN and Post -op Vital signs reviewed and stable  Post vital signs: Reviewed and stable  Last Vitals:  Vitals Value Taken Time  BP    Temp    Pulse 74 12/05/19 0949  Resp 16 12/05/19 0949  SpO2 93 % 12/05/19 0949  Vitals shown include unvalidated device data.  Last Pain:  Vitals:   12/05/19 0846  TempSrc: Temporal  PainSc: 0-No pain         Complications: No complications documented.

## 2019-12-05 NOTE — Op Note (Signed)
[facility] Gastroenterology Patient Name: Franklin Richardson Procedure Date: 12/05/2019 9:12 AM MRN: [id number] Account #: [id number] Date of Birth: [birth year] Admit Type: Outpatient Age: 59 Room: [facility] ENDO ROOM 1 Gender: Male Note Status: Finalized Procedure:             Colonoscopy Indications:           High risk colon cancer surveillance: Personal history                         of colonic polyps Providers:             Jonathon Bellows MD, MD Medicines:             Monitored Anesthesia Care Complications:         No immediate complications. Procedure:             Pre-Anesthesia Assessment:                        - Prior to the procedure, a History and Physical was                         performed, and patient medications, allergies and                         sensitivities were reviewed. The patient's tolerance                         of previous anesthesia was reviewed.                        - The risks and benefits of the procedure and the                         sedation options and risks were discussed with the                         patient. All questions were answered and informed                         consent was obtained.                        - ASA Grade Assessment: II - A patient with mild                         systemic disease.                        After obtaining informed consent, the colonoscope was                         passed under direct vision. Throughout the procedure,                         the patient's blood pressure, pulse, and oxygen                         saturations were monitored continuously. The  Colonoscope was introduced through the anus and                         advanced to the the cecum, identified by the                         appendiceal orifice. The colonoscopy was performed                         with ease. The patient tolerated the procedure well.                         The quality of the bowel  preparation was poor. Findings:      The perianal and digital rectal examinations were normal.      A 5 mm polyp was found in the transverse colon. The polyp was sessile.       The polyp was removed with a cold snare. Resection and retrieval were       complete. Impression:            - Preparation of the colon was poor.                        - One 5 mm polyp in the transverse colon, removed with                         a cold snare. Resected and retrieved. Recommendation:        - Await pathology results.                        - Discharge patient to home (with escort).                        - Resume previous diet.                        - Continue present medications.                        - Repeat colonoscopy in 2 weeks because the bowel                         preparation was suboptimal. Procedure Code(s):     --- Professional ---                        [phone], Colonoscopy, flexible; with removal of                         tumor(s), polyp(s), or other lesion(s) by snare                         technique Diagnosis Code(s):     --- Professional ---                        Z86.010, Personal history of colonic polyps                        K63.5, Polyp of colon CPT copyright 2019 American Medical Association. All rights reserved. The codes  documented in this report are preliminary and upon coder review may  be revised to meet current compliance requirements. Jonathon Bellows, MD Jonathon Bellows MD, MD 12/05/2019 9:45:16 AM This report has been signed electronically. Number of Addenda: 0 Note Initiated On: 12/05/2019 9:12 AM Scope Withdrawal Time: 0 hours 6 minutes 17 seconds  Total Procedure Duration: 0 hours 7 minutes 27 seconds       [facility]

## 2019-12-05 NOTE — Op Note (Signed)
[facility] Gastroenterology Patient Name: Franklin Richardson Procedure Date: 12/05/2019 9:13 AM MRN: [id number] Account #: [id number] Date of Birth: 1961/06/19 Admit Type: Outpatient Age: 59 Room: [facility] ENDO ROOM 1 Gender: Male Note Status: Finalized Procedure:             Upper GI endoscopy Indications:           Dysphagia Providers:             Jonathon Bellows MD, MD Referring MD:          Delsa Grana (Referring MD) Medicines:             Monitored Anesthesia Care Complications:         No immediate complications. Procedure:             Pre-Anesthesia Assessment:                        - Prior to the procedure, a History and Physical was                         performed, and patient medications, allergies and                         sensitivities were reviewed. The patient's tolerance                         of previous anesthesia was reviewed.                        - The risks and benefits of the procedure and the                         sedation options and risks were discussed with the                         patient. All questions were answered and informed                         consent was obtained.                        - ASA Grade Assessment: II - A patient with mild                         systemic disease.                        After obtaining informed consent, the endoscope was                         passed under direct vision. Throughout the procedure,                         the patient's blood pressure, pulse, and oxygen                         saturations were monitored continuously. The Endoscope                         was introduced through the mouth, and advanced to the  third part of duodenum. The upper GI endoscopy was                         accomplished with ease. The patient tolerated the                         procedure well. Findings:      The examined duodenum was normal.      The stomach was normal.      A small  hiatal hernia was present.      The cardia and gastric fundus were normal on retroflexion.      One benign-appearing, intrinsic mild stenosis was found at the       gastroesophageal junction. This stenosis measured 1.4 cm (inner       diameter) x less than one cm (in length). The stenosis was traversed. A       TTS dilator was passed through the scope. Dilation with a 15-16.5-18 mm       balloon dilator was performed to 18 mm. The dilation site was examined       and showed moderate mucosal disruption.      Normal mucosa was found in the entire esophagus. Biopsies were taken       with a cold forceps for histology. Impression:            - Normal examined duodenum.                        - Normal stomach.                        - Small hiatal hernia.                        - Benign-appearing esophageal stenosis. Dilated.                        - Normal mucosa was found in the entire esophagus.                         Biopsied. Recommendation:        - Await pathology results.                        - Perform a colonoscopy today. Procedure Code(s):     --- Professional ---                        [phone], Esophagogastroduodenoscopy, flexible,                         transoral; with transendoscopic balloon dilation of                         esophagus (less than 30 mm diameter)                        43239, 59, Esophagogastroduodenoscopy, flexible,                         transoral; with biopsy, single or multiple Diagnosis Code(s):     --- Professional ---  K44.9, Diaphragmatic hernia without obstruction or                         gangrene                        K22.2, Esophageal obstruction                        R13.10, Dysphagia, unspecified CPT copyright 2019 American Medical Association. All rights reserved. The codes documented in this report are preliminary and upon coder review may  be revised to meet current compliance requirements. Jonathon Bellows, MD Jonathon Bellows MD, MD 12/05/2019 9:34:12 AM This report has been signed electronically. Number of Addenda: 0 Note Initiated On: 12/05/2019 9:13 AM Estimated Blood Loss:  Estimated blood loss: none.      [facility])

## 2019-12-06 ENCOUNTER — Encounter: Payer: Self-pay | Admitting: Gastroenterology

## 2019-12-06 LAB — SURGICAL PATHOLOGY

## 2019-12-09 ENCOUNTER — Ambulatory Visit: Payer: Self-pay

## 2019-12-09 DIAGNOSIS — I10 Essential (primary) hypertension: Secondary | ICD-10-CM

## 2019-12-09 NOTE — Chronic Care Management (AMB) (Signed)
Chronic Care Management   Follow Up Note   12/09/2019 Name: Franklin Richardson MRN: [id number] DOB: 1960/10/13  Primary Care Provider: Delsa Grana, PA-C Reason for referral : Chronic Care Management   Franklin Richardson is a 59 y.o. year old male who is a primary care patient of Delsa Grana, [place]. He is currently engaged with the chronic care management team. A routine telephonic outreach was conducted today.  Review of Franklin Richardson status, including review of consultants reports, relevant labs and test results was conducted today. Collaboration with appropriate care team members was performed as part of the comprehensive evaluation and provision of chronic care management services.     SDOH (Social Determinants of Health) assessments performed: No     Outpatient Encounter Medications as of 12/09/2019  Medication Sig  . Adalimumab (HUMIRA PEN) 40 MG/0.4ML PNKT Inject into the skin. Inject once every 2 weeks  . amLODipine (NORVASC) 10 MG tablet Take 1 tablet (10 mg total) by mouth daily.  [name] atenolol (TENORMIN) 25 MG tablet Take 1 tablet (25 mg total) by mouth daily.  [name] atorvastatin (LIPITOR) 20 MG tablet Take 1 tablet (20 mg total) by mouth at bedtime. TAKE 1 TABLET AT BEDTIME (NEW DOSE)  . hydrochlorothiazide (HYDRODIURIL) 12.5 MG tablet Take 1 tablet (12.5 mg total) by mouth in the morning.  [name] omeprazole (PRILOSEC) 40 MG capsule Take 1 capsule (40 mg total) by mouth daily.  [name] spironolactone (ALDACTONE) 25 MG tablet Take 1 tablet (25 mg total) by mouth 2 (two) times daily.   No facility-administered encounter medications on file as of 12/09/2019.     Objective:  BP Readings from Last 3 Encounters:  12/05/19 134/82  11/20/19 (!) 146/81  11/13/19 118/80   Lab Results  Component Value Date   CHOL 187 11/13/2019   HDL 46 11/13/2019   LDLCALC 115 (H) 11/13/2019   TRIG 146 11/13/2019   CHOLHDL 4.1 11/13/2019    Goals Addressed            This Visit's Progress   . COMPLETED: Chronic  Disease Management       Current Barriers:  . Chronic Disease Management support and education needs related to hypertension and hyperlipidemia.  Case Manager Clinical Goal(s):  [name] Over the next 90 days, patient will take all medications as prescribed. . Over the next 90 days, patient will monitor blood pressure and record readings. . Over the next 90 days, patient will adhere to recommended cardiac prudent/heart healthy diet. . Over the next 90 days, patient will follow recommended safety and fall prevention measures. . Over the next 90 days patient will attend all medical appointments as scheduled.  Interventions:  . Reviewed medications. Reports having all needed prescriptions. Declines need for medication assistance. . Discussed compliance with blood pressure monitoring. Reports blood pressure readings have been within established range. . Discussed plan for ongoing care management and follow-up. Continues to attend appointments as scheduled. Evaluated by PCP on 11/13/19 and completed colonoscopy on 12/05/19. Scheduled for follow-up with GI in August. Denies urgent concerns or changes in care management needs. Agreeable to follow-up with the care management team in 3 months.      Patient Self Care Activities:  . Self administers medications  . Attends scheduled provider appointments . Calls pharmacy for medication refills . Performs ADL's independently . Performs IADL's independently   Please see past updates related to this goal by clicking on the "Past Updates" button in the selected goal  PLAN The care management team will follow-up with Franklin Richardson in three months.   [facility] Care Management [phone]

## 2019-12-19 ENCOUNTER — Telehealth: Payer: Self-pay

## 2019-12-19 NOTE — Telephone Encounter (Signed)
-----   Message from Jonathon Bellows, MD sent at 12/15/2019  1:41 PM EDT ----- Inform polyp was adenoma- repeat colonoscopy as prep was poor- 2 day prep recommended  C/c Delsa Grana, PA-C   Dr Jonathon Bellows MD,MRCP [facility]) Gastroenterology/Hepatology Pager: [phone]

## 2019-12-19 NOTE — Telephone Encounter (Signed)
Spoke with pt and informed him of biopsy results and Dr. Georgeann Oppenheim recommendations. Pt understands and has decided he'd like to wait to repeat the procedure. Pt states he'll contact our office when he's ready to schedule.

## 2019-12-23 NOTE — Patient Instructions (Signed)
Thank you for allowing the Chronic Care Management team to participate in your care.  Goals Addressed            This Visit's Progress   . COMPLETED: Chronic Disease Management       Current Barriers:  . Chronic Disease Management support and education needs related to hypertension and hyperlipidemia.  Case Manager Clinical Goal(s):  [name] Over the next 90 days, patient will take all medications as prescribed. . Over the next 90 days, patient will monitor blood pressure and record readings. . Over the next 90 days, patient will adhere to recommended cardiac prudent/heart healthy diet. . Over the next 90 days, patient will follow recommended safety and fall prevention measures. . Over the next 90 days patient will attend all medical appointments as scheduled.  Interventions:  . Reviewed medications. Reports having all needed prescriptions. Declines need for medication assistance. . Discussed compliance with blood pressure monitoring. Reports blood pressure readings have been within established range. . Discussed plan for ongoing care management and follow-up. Continues to attend appointments as scheduled. Evaluated by PCP on 11/13/19 and completed colonoscopy on 12/05/19. Scheduled for follow-up with GI in August. Denies urgent concerns or changes in care management needs. Agreeable to follow-up with the care management team in 3 months.      Patient Self Care Activities:  . Self administers medications  . Attends scheduled provider appointments . Calls pharmacy for medication refills . Performs ADL's independently . Performs IADL's independently   Please see past updates related to this goal by clicking on the "Past Updates" button in the selected goal         Mr. Pargas verbalized understanding of the information discussed during the telephonic outreach today. Declined need for printed/mailed instructions.   The care management team will follow-up with Mr. Wafer in 3  months.   [facility] Care Management [phone]

## 2020-02-20 ENCOUNTER — Ambulatory Visit: Payer: Medicare HMO | Admitting: Gastroenterology

## 2020-02-20 ENCOUNTER — Other Ambulatory Visit: Payer: Self-pay | Admitting: Gastroenterology

## 2020-02-20 NOTE — Progress Notes (Deleted)
Jonathon Bellows MD, MRCP(U.K) [address]  [place]  [place], [place] [address]  Main: [phone]  Fax: [phone]   Primary Care Physician: Delsa Grana, PA-C  Primary Gastroenterologist:  Dr. Jonathon Bellows   No chief complaint on file.   HPI: Franklin Richardson is a 59 y.o. male   Summary of history :  Last colonoscopy in June 2017 for colon cancer screening at [facility].  5 polyps 1 to 4 mm in size were removed from the cecum and descending colon.One of them was an adenomatous polyp.  Seen by [facility] back in 2017 for dysphagia.  EGD was performed and a benign stenosis was noted at 39 cm from the incisors.  Dilated to 20 mm. Small hiatal hernia was noted.  Treated for H. pylori infection in the past.  In May 2021 hemoglobin 13.6 g and CMP was normal.  He said that he has had dysphagia for solids and liquids ongoing for a few months.  No progression.  No weight loss.  No odynophagia.  No history of allergies.  Denies any heartburn.  Not on any PPI.  No lower GI symptoms.   Interval history   11/20/2019- 02/20/2020  12/05/2019: colonoscopy : 1 polyp in the transverse colon. EGD. Small hiatal hernia   Current Outpatient Medications  Medication Sig Dispense Refill  . Adalimumab (HUMIRA PEN) 40 MG/0.4ML PNKT Inject into the skin. Inject once every 2 weeks    . amLODipine (NORVASC) 10 MG tablet Take 1 tablet (10 mg total) by mouth daily. 90 tablet 3  . atenolol (TENORMIN) 25 MG tablet Take 1 tablet (25 mg total) by mouth daily. 90 tablet 3  . atorvastatin (LIPITOR) 20 MG tablet Take 1 tablet (20 mg total) by mouth at bedtime. TAKE 1 TABLET AT BEDTIME (NEW DOSE) 90 tablet 3  . hydrochlorothiazide (HYDRODIURIL) 12.5 MG tablet Take 1 tablet (12.5 mg total) by mouth in the morning. 90 tablet 3  . omeprazole (PRILOSEC) 40 MG capsule Take 1 capsule (40 mg total) by mouth daily. 90 capsule 0  . spironolactone (ALDACTONE) 25 MG tablet Take 1 tablet (25 mg total) by mouth 2 (two) times daily. 180 tablet  3   No current facility-administered medications for this visit.    Allergies as of 02/20/2020 - Review Complete 12/05/2019  Allergen Reaction Noted  . Ace inhibitors Shortness Of Breath and Swelling 03/25/2015  . Quinapril Swelling 02/05/2015    ROS:  General: Negative for anorexia, weight loss, fever, chills, fatigue, weakness. ENT: Negative for hoarseness, difficulty swallowing , nasal congestion. CV: Negative for chest pain, angina, palpitations, dyspnea on exertion, peripheral edema.  Respiratory: Negative for dyspnea at rest, dyspnea on exertion, cough, sputum, wheezing.  GI: See history of present illness. GU:  Negative for dysuria, hematuria, urinary incontinence, urinary frequency, nocturnal urination.  Endo: Negative for unusual weight change.    Physical Examination:   There were no vitals taken for this visit.  General: Well-nourished, well-developed in no acute distress.  Eyes: No icterus. Conjunctivae pink. Mouth: Oropharyngeal mucosa moist and pink , no lesions erythema or exudate. Lungs: Clear to auscultation bilaterally. Non-labored. Heart: Regular rate and rhythm, no murmurs rubs or gallops.  Abdomen: Bowel sounds are normal, nontender, nondistended, no hepatosplenomegaly or masses, no abdominal bruits or hernia , no rebound or guarding.   Extremities: No lower extremity edema. No clubbing or deformities. Neuro: Alert and oriented x 3.  Grossly intact. Skin: Warm and dry, no jaundice.   Psych: Alert and  cooperative, normal mood and affect.   Imaging Studies: No results found.  Assessment and Plan:   Franklin Richardson is a 59 y.o. y/o male here to follow up  for colon cancer screening with a prior history of adenomatous polyps in 2017.  History of dysphagia in the past and had a esophageal stricture dilated to 20 mm in 2017.  Presently also has dysphagia for solids and liquids ongoing for a few months.  Likely due to acid reflux and dysmotility.  Plan 1.   Colonoscopy for colon cancer polyp surveillance, at the same time will perform EGD and possible dilation and rule out eosinophilic esophagitis.  Commence on Prilosec 40 mg once a day prescription sent.  Dr Jonathon Bellows  MD,MRCP [facility]) Follow up in ***

## 2020-03-07 DIAGNOSIS — M069 Rheumatoid arthritis, unspecified: Secondary | ICD-10-CM | POA: Diagnosis not present

## 2020-03-07 DIAGNOSIS — J029 Acute pharyngitis, unspecified: Secondary | ICD-10-CM | POA: Diagnosis not present

## 2020-03-18 ENCOUNTER — Ambulatory Visit: Payer: Self-pay

## 2020-03-18 DIAGNOSIS — I10 Essential (primary) hypertension: Secondary | ICD-10-CM

## 2020-03-18 NOTE — Chronic Care Management (AMB) (Signed)
Chronic Care Management   Follow Up Note   03/18/2020 Name: Franklin Richardson MRN: [id number] DOB: 04/02/1961  Primary Care Provider: Delsa Grana, PA-C Reason for referral : Chronic Care Management   Franklin Richardson is a 59 y.o. year old male who is a primary care patient of Delsa Grana, [place]. A telephonic outreach was conducted today. His care management goals have been met.   Review of Franklin Richardson status, including review of consultants reports, relevant labs and test results was conducted today. Collaboration with appropriate care team members was performed as part of the comprehensive evaluation and provision of chronic care management services.     SDOH (Social Determinants of Health) assessments performed: No    Outpatient Encounter Medications as of 03/18/2020  Medication Sig  . Adalimumab (HUMIRA PEN) 40 MG/0.4ML PNKT Inject into the skin. Inject once every 2 weeks  . amLODipine (NORVASC) 10 MG tablet Take 1 tablet (10 mg total) by mouth daily.  [name] atenolol (TENORMIN) 25 MG tablet Take 1 tablet (25 mg total) by mouth daily.  [name] atorvastatin (LIPITOR) 20 MG tablet Take 1 tablet (20 mg total) by mouth at bedtime. TAKE 1 TABLET AT BEDTIME (NEW DOSE)  . hydrochlorothiazide (HYDRODIURIL) 12.5 MG tablet Take 1 tablet (12.5 mg total) by mouth in the morning.  [name] omeprazole (PRILOSEC) 40 MG capsule TAKE 1 CAPSULE BY MOUTH EVERY DAY  . spironolactone (ALDACTONE) 25 MG tablet Take 1 tablet (25 mg total) by mouth 2 (two) times daily.   No facility-administered encounter medications on file as of 03/18/2020.     Objective:  BP Readings from Last 3 Encounters:  12/05/19 134/82  11/20/19 (!) 146/81  11/13/19 118/80   Goals Addressed            This Visit's Progress   . COMPLETED: Chronic Disease Management       Current Barriers:  . Chronic Disease Management support and education needs related to hypertension and hyperlipidemia.  Case Manager Clinical Goal(s):  [name] Over the next 90  days, patient will take all medications as prescribed. . Over the next 90 days, patient will monitor blood pressure and record readings. . Over the next 90 days, patient will adhere to recommended cardiac prudent/heart healthy diet. . Over the next 90 days, patient will follow recommended safety and fall prevention measures. . Over the next 90 days patient will attend all medical appointments as scheduled.  Interventions:  . Discussed plan for care management. Franklin Richardson reports doing well since the last outreach. He reports following up with the GI team and anticipates completing another colonoscopy around January 2022. Reports taking medications as prescribed and monitoring BP readings as advised. Reports home readings have been within range. Per his request, the [facility] pharmacy was contacted today regarding pending medication refills. His maintenance medications will be mailed out within the next few days. He denies urgent concerns or additional care management needs. His care management goals have been met. He was encouraged to contact the team if his needs change and he requires additional assistance. He is scheduled for outreach with his PCP on 05/15/20.     Patient Self Care Activities:  . Self administers medications  . Attends scheduled provider appointments . Calls pharmacy for medication refills . Performs ADL's independently . Performs IADL's independently   Please see past updates related to this goal by clicking on the "Past Updates" button in the selected goal         PLAN Franklin Richardson has  met his care management goals. The care management team will gladly assist if additional assistance is needed. He is scheduled for outreach with his PCP on 05/15/20.    [facility] Care Management [facility] [phone]

## 2020-03-25 DIAGNOSIS — H00021 Hordeolum internum right upper eyelid: Secondary | ICD-10-CM | POA: Diagnosis not present

## 2020-03-25 NOTE — Patient Instructions (Signed)
  Thank you for allowing the Chronic Care Management team to participate in your care.  Goals Addressed            This Visit's Progress   . COMPLETED: Chronic Disease Management       Current Barriers:  . Chronic Disease Management support and education needs related to hypertension and hyperlipidemia.  Case Manager Clinical Goal(s):  Franklin Kitchen Over the next 90 days, patient will take all medications as prescribed. . Over the next 90 days, patient will monitor blood pressure and record readings. . Over the next 90 days, patient will adhere to recommended cardiac prudent/heart healthy diet. . Over the next 90 days, patient will follow recommended safety and fall prevention measures. . Over the next 90 days patient will attend all medical appointments as scheduled.  Interventions:  . Discussed plan for care management. Franklin Richardson reports doing well since the last outreach. He reports following up with the GI team and anticipates completing another colonoscopy around January 2022. Reports taking medications as prescribed and monitoring BP readings as advised. Reports home readings have been within range. Per his request, the [facility] pharmacy was contacted today regarding pending medication refills. His maintenance medications will be mailed out within the next few days. He denies urgent concerns or additional care management needs. His care management goals have been met. He was encouraged to contact the team if his needs change and he requires additional assistance. He is scheduled for outreach with his PCP on 05/15/20.     Patient Self Care Activities:  . Self administers medications  . Attends scheduled provider appointments . Calls pharmacy for medication refills . Performs ADL's independently . Performs IADL's independently   Please see past updates related to this goal by clicking on the "Past Updates" button in the selected goal        Franklin Richardson verbalized understanding of the  information discussed during the telephonic outreach today. Declined need for a mailed/printed copy of the information.   Franklin Richardson has met his care management goals. The care management team will gladly assist if additional assistance is needed. He is scheduled for outreach with his PCP on 05/15/20.    Franklin [facility] Care Management [facility] [phone]

## 2020-03-31 DIAGNOSIS — M4328 Fusion of spine, sacral and sacrococcygeal region: Secondary | ICD-10-CM | POA: Diagnosis not present

## 2020-03-31 DIAGNOSIS — Z79899 Other long term (current) drug therapy: Secondary | ICD-10-CM | POA: Diagnosis not present

## 2020-03-31 DIAGNOSIS — Z9889 Other specified postprocedural states: Secondary | ICD-10-CM | POA: Diagnosis not present

## 2020-03-31 DIAGNOSIS — Z23 Encounter for immunization: Secondary | ICD-10-CM | POA: Diagnosis not present

## 2020-03-31 DIAGNOSIS — M47819 Spondylosis without myelopathy or radiculopathy, site unspecified: Secondary | ICD-10-CM | POA: Diagnosis not present

## 2020-03-31 DIAGNOSIS — T783XXA Angioneurotic edema, initial encounter: Secondary | ICD-10-CM | POA: Diagnosis not present

## 2020-03-31 DIAGNOSIS — Z87820 Personal history of traumatic brain injury: Secondary | ICD-10-CM | POA: Diagnosis not present

## 2020-03-31 DIAGNOSIS — I10 Essential (primary) hypertension: Secondary | ICD-10-CM | POA: Diagnosis not present

## 2020-03-31 DIAGNOSIS — Z981 Arthrodesis status: Secondary | ICD-10-CM | POA: Diagnosis not present

## 2020-05-15 ENCOUNTER — Ambulatory Visit: Payer: Medicare HMO | Admitting: Family Medicine

## 2020-10-01 ENCOUNTER — Ambulatory Visit: Payer: Medicare HMO

## 2020-11-04 ENCOUNTER — Telehealth: Payer: Self-pay | Admitting: Family Medicine

## 2020-11-04 NOTE — Telephone Encounter (Signed)
Copied from [place] [phone]. Topic: Medicare AWV >> Nov 04, 2020  3:02 PM Cher Nakai R wrote: Reason for CRM:  Left message for patient to call back and schedule Medicare Annual Wellness Visit (AWV) in office.   If unable to come into the office for AWV,  please offer to do virtually or by telephone.  Last AWV:  10/01/2019  Please schedule at anytime with [facility].  40 minute appointment  Any questions, please contact me at [phone]

## 2020-11-19 ENCOUNTER — Other Ambulatory Visit: Payer: Self-pay | Admitting: Family Medicine

## 2020-11-19 DIAGNOSIS — I10 Essential (primary) hypertension: Secondary | ICD-10-CM

## 2020-11-30 ENCOUNTER — Other Ambulatory Visit: Payer: Self-pay | Admitting: Family Medicine

## 2020-11-30 DIAGNOSIS — I10 Essential (primary) hypertension: Secondary | ICD-10-CM

## 2020-11-30 NOTE — Telephone Encounter (Signed)
Pt needs appt for further refills. 

## 2020-11-30 NOTE — Telephone Encounter (Signed)
Lvm to schedule futturer appt

## 2020-12-01 ENCOUNTER — Ambulatory Visit (INDEPENDENT_AMBULATORY_CARE_PROVIDER_SITE_OTHER): Payer: Medicare HMO

## 2020-12-01 ENCOUNTER — Other Ambulatory Visit: Payer: Self-pay

## 2020-12-01 VITALS — BP 140/74 | HR 59 | Temp 97.6°F | Resp 16 | Ht 73.0 in | Wt 203.4 lb

## 2020-12-01 DIAGNOSIS — Z1211 Encounter for screening for malignant neoplasm of colon: Secondary | ICD-10-CM

## 2020-12-01 DIAGNOSIS — Z0000 Encounter for general adult medical examination without abnormal findings: Secondary | ICD-10-CM | POA: Diagnosis not present
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
## 2020-12-01 NOTE — Patient Instructions (Signed)
Franklin Richardson , Thank you for taking time to come for your Medicare Wellness Visit. I appreciate your ongoing commitment to your health goals. Please review the following plan we discussed and let me know if I can assist you in the future.   Screening recommendations/referrals: Colonoscopy: done 12/05/19. Referral sent to [facility] for repeat screening colonoscopy. They will contact you for an appointment.  Recommended yearly ophthalmology/optometry visit for glaucoma screening and checkup Recommended yearly dental visit for hygiene and checkup  Vaccinations: Influenza vaccine: done 03/31/20 Pneumococcal vaccine: done 09/22/15 Tdap vaccine: done 06/23/16 Shingles vaccine: Shingrix discussed. Please contact your pharmacy for coverage information.    Covid-19: done 01/08/20 & 01/29/20  Advanced directives: Advance directive discussed with you today. Even though you declined this today please call our office should you change your mind and we can give you the proper paperwork for you to fill out.  Conditions/risks identified:   Free hearing clinics offered in [place]:   [facility] [address], [place], [place] [address] [phone]  [facility] [place], [place],  [address] [phone]   Next appointment: Follow up in one year for your annual wellness visit.   Preventive Care 60 Years and Older, Male Preventive care refers to lifestyle choices and visits with your health care provider that can promote health and wellness. What does preventive care include?  A yearly physical exam. This is also called an annual well check.  Dental exams once or twice a year.  Routine eye exams. Ask your health care provider how often you should have your eyes checked.  Personal lifestyle choices, including:  Daily care of your teeth and gums.  Regular physical activity.  Eating a healthy diet.  Avoiding tobacco and drug  use.  Limiting alcohol use.  Practicing safe sex.  Taking low doses of aspirin every day.  Taking vitamin and mineral supplements as recommended by your health care provider. What happens during an annual well check? The services and screenings done by your health care provider during your annual well check will depend on your age, overall health, lifestyle risk factors, and family history of disease. Counseling  Your health care provider may ask you questions about your:  Alcohol use.  Tobacco use.  Drug use.  Emotional well-being.  Home and relationship well-being.  Sexual activity.  Eating habits.  History of falls.  Memory and ability to understand (cognition).  Work and work Statistician. Screening  You may have the following tests or measurements:  Height, weight, and BMI.  Blood pressure.  Lipid and cholesterol levels. These may be checked every 5 years, or more frequently if you are over 12 years old.  Skin check.  Lung cancer screening. You may have this screening every year starting at age 36 if you have a 30-pack-year history of smoking and currently smoke or have quit within the past 15 years.  Fecal occult blood test (FOBT) of the stool. You may have this test every year starting at age 4.  Flexible sigmoidoscopy or colonoscopy. You may have a sigmoidoscopy every 5 years or a colonoscopy every 10 years starting at age 86.  Prostate cancer screening. Recommendations will vary depending on your family history and other risks.  Hepatitis C blood test.  Hepatitis B blood test.  Sexually transmitted disease (STD) testing.  Diabetes screening. This is done by checking your blood sugar (glucose) after you have not eaten for a while (fasting). You may  have this done every 1-3 years.  Abdominal aortic aneurysm (AAA) screening. You may need this if you are a current or former smoker.  Osteoporosis. You may be screened starting at age 41 if you are at  high risk. Talk with your health care provider about your test results, treatment options, and if necessary, the need for more tests. Vaccines  Your health care provider may recommend certain vaccines, such as:  Influenza vaccine. This is recommended every year.  Tetanus, diphtheria, and acellular pertussis (Tdap, Td) vaccine. You may need a Td booster every 10 years.  Zoster vaccine. You may need this after age 60.  Pneumococcal 13-valent conjugate (PCV13) vaccine. One dose is recommended after age 60.  Pneumococcal polysaccharide (PPSV23) vaccine. One dose is recommended after age 60. Talk to your health care provider about which screenings and vaccines you need and how often you need them. This information is not intended to replace advice given to you by your health care provider. Make sure you discuss any questions you have with your health care provider. Document Released: 07/10/2015 Document Revised: 03/02/2016 Document Reviewed: 04/14/2015 Elsevier Interactive Patient Education  2017 [employer] Prevention in the Home Falls can cause injuries. They can happen to people of all ages. There are many things you can do to make your home safe and to help prevent falls. What can I do on the outside of my home?  Regularly fix the edges of walkways and driveways and fix any cracks.  Remove anything that might make you trip as you walk through a door, such as a raised step or threshold.  Trim any bushes or trees on the path to your home.  Use bright outdoor lighting.  Clear any walking paths of anything that might make someone trip, such as rocks or tools.  Regularly check to see if handrails are loose or broken. Make sure that both sides of any steps have handrails.  Any raised decks and porches should have guardrails on the edges.  Have any leaves, snow, or ice cleared regularly.  Use sand or salt on walking paths during winter.  Clean up any spills in your garage  right away. This includes oil or grease spills. What can I do in the bathroom?  Use night lights.  Install grab bars by the toilet and in the tub and shower. Do not use towel bars as grab bars.  Use non-skid mats or decals in the tub or shower.  If you need to sit down in the shower, use a plastic, non-slip stool.  Keep the floor dry. Clean up any water that spills on the floor as soon as it happens.  Remove soap buildup in the tub or shower regularly.  Attach bath mats securely with double-sided non-slip rug tape.  Do not have throw rugs and other things on the floor that can make you trip. What can I do in the bedroom?  Use night lights.  Make sure that you have a light by your bed that is easy to reach.  Do not use any sheets or blankets that are too big for your bed. They should not hang down onto the floor.  Have a firm chair that has side arms. You can use this for support while you get dressed.  Do not have throw rugs and other things on the floor that can make you trip. What can I do in the kitchen?  Clean up any spills right away.  Avoid walking on wet  floors.  Keep items that you use a lot in easy-to-reach places.  If you need to reach something above you, use a strong step stool that has a grab bar.  Keep electrical cords out of the way.  Do not use floor polish or wax that makes floors slippery. If you must use wax, use non-skid floor wax.  Do not have throw rugs and other things on the floor that can make you trip. What can I do with my stairs?  Do not leave any items on the stairs.  Make sure that there are handrails on both sides of the stairs and use them. Fix handrails that are broken or loose. Make sure that handrails are as long as the stairways.  Check any carpeting to make sure that it is firmly attached to the stairs. Fix any carpet that is loose or worn.  Avoid having throw rugs at the top or bottom of the stairs. If you do have throw rugs,  attach them to the floor with carpet tape.  Make sure that you have a light switch at the top of the stairs and the bottom of the stairs. If you do not have them, ask someone to add them for you. What else can I do to help prevent falls?  Wear shoes that:  Do not have high heels.  Have rubber bottoms.  Are comfortable and fit you well.  Are closed at the toe. Do not wear sandals.  If you use a stepladder:  Make sure that it is fully opened. Do not climb a closed stepladder.  Make sure that both sides of the stepladder are locked into place.  Ask someone to hold it for you, if possible.  Clearly mark and make sure that you can see:  Any grab bars or handrails.  First and last steps.  Where the edge of each step is.  Use tools that help you move around (mobility aids) if they are needed. These include:  Canes.  Walkers.  Scooters.  Crutches.  Turn on the lights when you go into a dark area. Replace any light bulbs as soon as they burn out.  Set up your furniture so you have a clear path. Avoid moving your furniture around.  If any of your floors are uneven, fix them.  If there are any pets around you, be aware of where they are.  Review your medicines with your doctor. Some medicines can make you feel dizzy. This can increase your chance of falling. Ask your doctor what other things that you can do to help prevent falls. This information is not intended to replace advice given to you by your health care provider. Make sure you discuss any questions you have with your health care provider. Document Released: 04/09/2009 Document Revised: 11/19/2015 Document Reviewed: 07/18/2014 Elsevier Interactive Patient Education  2017 [employer].

## 2020-12-07 ENCOUNTER — Encounter: Payer: Self-pay | Admitting: *Deleted

## 2021-01-11 DIAGNOSIS — H903 Sensorineural hearing loss, bilateral: Secondary | ICD-10-CM | POA: Diagnosis not present

## 2021-01-29 DIAGNOSIS — H20021 Recurrent acute iridocyclitis, right eye: Secondary | ICD-10-CM | POA: Diagnosis not present

## 2021-06-24 ENCOUNTER — Other Ambulatory Visit: Payer: Self-pay

## 2021-06-24 DIAGNOSIS — I10 Essential (primary) hypertension: Secondary | ICD-10-CM

## 2021-06-25 ENCOUNTER — Other Ambulatory Visit: Payer: Self-pay

## 2021-06-25 DIAGNOSIS — I10 Essential (primary) hypertension: Secondary | ICD-10-CM

## 2021-06-25 MED ORDER — HYDROCHLOROTHIAZIDE 12.5 MG PO TABS: 12.5000 mg | ORAL_TABLET | Freq: Every morning | ORAL | 0 refills | Status: DC

## 2021-06-25 MED ORDER — SPIRONOLACTONE 25 MG PO TABS
25.0000 mg | ORAL_TABLET | Freq: Two times a day (BID) | ORAL | 0 refills | Status: DC
Start: 2021-06-25 — End: 2021-07-01

## 2021-06-25 MED ORDER — ATENOLOL 25 MG PO TABS: 25.0000 mg | ORAL_TABLET | Freq: Every day | ORAL | 0 refills | Status: DC

## 2021-06-25 MED ORDER — AMLODIPINE BESYLATE 10 MG PO TABS
10.0000 mg | ORAL_TABLET | Freq: Every day | ORAL | 0 refills | Status: DC
Start: 2021-06-25 — End: 2021-07-01

## 2021-06-25 MED ORDER — ATORVASTATIN CALCIUM 20 MG PO TABS: ORAL_TABLET | ORAL | 0 refills | Status: DC

## 2021-06-25 NOTE — Telephone Encounter (Signed)
Patient given 45 day supply of medications He will need to be seen in office for follow up and lab work as he has not been seen for some time.

## 2021-07-01 ENCOUNTER — Encounter: Payer: Self-pay | Admitting: Nurse Practitioner

## 2021-07-01 ENCOUNTER — Other Ambulatory Visit: Payer: Self-pay

## 2021-07-01 ENCOUNTER — Ambulatory Visit (INDEPENDENT_AMBULATORY_CARE_PROVIDER_SITE_OTHER): Payer: Medicare HMO | Admitting: Nurse Practitioner

## 2021-07-01 VITALS — BP 130/80 | HR 76 | Temp 97.6°F | Resp 16 | Ht 73.0 in | Wt 204.1 lb

## 2021-07-01 DIAGNOSIS — Z23 Encounter for immunization: Secondary | ICD-10-CM | POA: Diagnosis not present

## 2021-07-01 DIAGNOSIS — R7303 Prediabetes: Secondary | ICD-10-CM

## 2021-07-01 DIAGNOSIS — E782 Mixed hyperlipidemia: Secondary | ICD-10-CM

## 2021-07-01 DIAGNOSIS — K219 Gastro-esophageal reflux disease without esophagitis: Secondary | ICD-10-CM | POA: Diagnosis not present

## 2021-07-01 DIAGNOSIS — I10 Essential (primary) hypertension: Secondary | ICD-10-CM

## 2021-07-01 MED ORDER — SPIRONOLACTONE 25 MG PO TABS: 25.0000 mg | ORAL_TABLET | Freq: Two times a day (BID) | ORAL | 1 refills | Status: DC

## 2021-07-01 MED ORDER — HYDROCHLOROTHIAZIDE 12.5 MG PO TABS: 12.5000 mg | ORAL_TABLET | Freq: Every morning | ORAL | 1 refills | Status: DC

## 2021-07-01 MED ORDER — ATORVASTATIN CALCIUM 20 MG PO TABS: ORAL_TABLET | ORAL | 1 refills | Status: DC

## 2021-07-01 MED ORDER — AMLODIPINE BESYLATE 10 MG PO TABS: 10.0000 mg | ORAL_TABLET | Freq: Every day | ORAL | 1 refills | Status: DC

## 2021-07-01 MED ORDER — ATENOLOL 25 MG PO TABS: 25.0000 mg | ORAL_TABLET | Freq: Every day | ORAL | 1 refills | Status: DC

## 2021-07-01 NOTE — Progress Notes (Signed)
BP 130/80    Pulse 76    Temp 97.6 F (36.4 C) (Oral)    Resp 16    Ht 6\' 1"  (1.854 m)    Wt 204 lb 1.6 oz (92.6 kg)    SpO2 98%    BMI 26.93 kg/m    Subjective:    Patient ID: Franklin Richardson, male    DOB: [birth year], 61 y.o.   MRN: [id number]  HPI: Franklin Richardson is a 61 y.o. male, here alone  Chief Complaint  Patient presents with   Hyperlipidemia   Hypertension   Gastroesophageal Reflux   HTN: He says he does not check his blood pressure at home.  He denies any chest pain, shortness of breath, headaches or blurred vision.  His blood pressure today in the office is 130/80.  He is currently taking amlodipine 10 mg, atenolol 25 mg, hydrochlorothiazide 12.5 mg and spironolactone 25 mg BID.    Hyperlipidemia: He is currently taking atorvastatin 20 mg daily.  He denies any myalgia.  His last LDL was 115 on 11/13/2019.  Will get labs today. The 10-year ASCVD risk score (Arnett DK, et al., 2019) is: 10.3%   Values used to calculate the score:     Age: 71 years     Sex: Male     Is Non-Hispanic African American: No     Diabetic: No     Tobacco smoker: No     Systolic Blood Pressure: 989 mmHg     Is BP treated: Yes     HDL Cholesterol: 46 mg/dL     Total Cholesterol: 187 mg/dL   GERD: He says he used to take omeprazole but he does not take it anymore.  He says that he has not had any episodes of acid reflux in quite awhile.   Prediabetes: His last A1C was 5.3 on 11/13/2019.  However on 07/04/2018 his A1c was 5.7.  He denies any polyuria, polyphagia or polydipsia.  Will get labs.   Relevant past medical, surgical, family and social history reviewed and updated as indicated. Interim medical history since our last visit reviewed. Allergies and medications reviewed and updated.  Review of Systems  Constitutional: Negative for fever or weight change.  Respiratory: Negative for cough and shortness of breath.   Cardiovascular: Negative for chest pain or palpitations.  Gastrointestinal:  Negative for abdominal pain, no bowel changes.  Musculoskeletal: Negative for gait problem or joint swelling.  Skin: Negative for rash.  Neurological: Negative for dizziness or headache.  No other specific complaints in a complete review of systems (except as listed in HPI above).      Objective:    BP 130/80    Pulse 76    Temp 97.6 F (36.4 C) (Oral)    Resp 16    Ht 6\' 1"  (1.854 m)    Wt 204 lb 1.6 oz (92.6 kg)    SpO2 98%    BMI 26.93 kg/m   Wt Readings from Last 3 Encounters:  07/01/21 204 lb 1.6 oz (92.6 kg)  12/01/20 203 lb 6.4 oz (92.3 kg)  12/05/19 210 lb 6.9 oz (95.5 kg)    Physical Exam  Constitutional: Patient appears well-developed and well-nourished.  No distress.  HEENT: head atraumatic, normocephalic, pupils equal and reactive to light, neck supple Cardiovascular: Normal rate, regular rhythm and normal heart sounds.  No murmur heard. No BLE edema. Pulmonary/Chest: Effort normal and breath sounds normal. No respiratory distress. Abdominal: Soft.  There is no tenderness.  Psychiatric: Patient has a normal mood and affect. behavior is normal. Judgment and thought content normal.   Results for orders placed or performed during the hospital encounter of 12/05/19  Surgical pathology  Result Value Ref Range   SURGICAL PATHOLOGY      SURGICAL PATHOLOGY CASE: [id number] PATIENT: Geanie Berlin Surgical Pathology Report     Specimen Submitted: A. Esophagus; cbx B. Colon polyp, transverse; cold snare  Clinical History: Colonoscopy personal history of colon polyps Z86.010 EGD dysphagia R13.10.  Esophageal stricture, colon polyp, poor prep      DIAGNOSIS: A.  ESOPHAGUS; COLD BIOPSY: - SQUAMOUS MUCOSA WITH FOCAL INTRAEPITHELIAL EOSINOPHILS, SEE COMMENT. - NEGATIVE FOR DYSPLASIA AND MALIGNANCY.  B.  COLON POLYP, TRANSVERSE; COLD SNARE: - TUBULAR ADENOMA. - NEGATIVE FOR HIGH-GRADE DYSPLASIA AND MALIGNANCY.  Comment: Sections of the esophageal biopsy  demonstrate intraepithelial eosinophils in the majority of the biopsy fragments with up to 6 eosinophils in a high-power field.  Correlation with clinical impression is required to determine whether these findings reflect reflux versus partially treated eosinophilic esophagitis. The former is favored.   GROSS DESCRIPTION: A. Labeled: Esophage al C BXs, rule out EOE Received: Formalin Tissue fragment(s): Multiple Size: Aggregate, 0.5 x 0.3 x 0.1 cm Description: Pale-tan soft tissue fragments Entirely submitted in 1 cassette.  B. Labeled: Transverse colon polyp, cold snare x1 Received: Formalin Tissue fragment(s): Multiple Size: Aggregate, 1.0 x 1.0 x 0.1 cm Description: Tan soft tissue fragments admixed with fecal material Entirely submitted in 1 cassette.   Final Diagnosis performed by Quay Burow, MD.   Electronically signed 12/06/2019 4:30:38PM The electronic signature indicates that the named Attending Pathologist has evaluated the specimen Technical component performed at [facility], [address], [place], [place] [address] Lab: [phone] Dir: Rush Farmer, MD, MMM  Professional component performed at [facility], [facility], [place], [place], [place] [address] Lab: [phone] Dir: Dellia Nims. Reuel Derby, MD       Assessment & Plan:   1. Essential hypertension  - CBC with Differential/Platelet - COMPLETE METABOLIC PANEL WITH GFR - amLODipine (NORVASC) 10 MG tablet; Take 1 tablet (10 mg total) by mouth daily.  Dispense: 90 tablet; Refill: 1 - atenolol (TENORMIN) 25 MG tablet; Take 1 tablet (25 mg total) by mouth daily.  Dispense: 90 tablet; Refill: 1 - hydrochlorothiazide (HYDRODIURIL) 12.5 MG tablet; Take 1 tablet (12.5 mg total) by mouth in the morning.  Dispense: 90 tablet; Refill: 1 - spironolactone (ALDACTONE) 25 MG tablet; Take 1 tablet (25 mg total) by mouth 2 (two) times daily.  Dispense: 90 tablet; Refill: 1  2. Mixed hyperlipidemia  -  Lipid panel - COMPLETE METABOLIC PANEL WITH GFR - atorvastatin (LIPITOR) 20 MG tablet; TAKE 1 TABLET (20 MG) BY MOUTH AT BEDTIME  Dispense: 90 tablet; Refill: 1  3. Gastroesophageal reflux disease without esophagitis   4. Prediabetes  - COMPLETE METABOLIC PANEL WITH GFR - Hemoglobin A1c  5. Need for influenza vaccination  - Flu Vaccine QUAD 6+ mos PF IM (Fluarix Quad PF)   Follow up plan: Return in about 6 months (around 12/29/2021) for follow up.

## 2021-07-02 LAB — CBC WITH DIFFERENTIAL/PLATELET
Absolute Monocytes: 469 cells/uL (ref 200–950)
Basophils Absolute: 27 cells/uL (ref 0–200)
Basophils Relative: 0.4 %
Eosinophils Absolute: 122 cells/uL (ref 15–500)
Eosinophils Relative: 1.8 %
HCT: 39.9 % (ref 38.5–50.0)
Hemoglobin: 13.6 g/dL (ref 13.2–17.1)
Lymphs Abs: 2496 cells/uL (ref 850–3900)
MCH: 29.7 pg (ref 27.0–33.0)
MCHC: 34.1 g/dL (ref 32.0–36.0)
MCV: 87.1 fL (ref 80.0–100.0)
MPV: 10.5 fL (ref 7.5–12.5)
Monocytes Relative: 6.9 %
Neutro Abs: 3686 cells/uL (ref 1500–7800)
Neutrophils Relative %: 54.2 %
Platelets: 288 10*3/uL (ref 140–400)
RBC: 4.58 10*6/uL (ref 4.20–5.80)
RDW: 12.9 % (ref 11.0–15.0)
Total Lymphocyte: 36.7 %
WBC: 6.8 10*3/uL (ref 3.8–10.8)

## 2021-07-02 LAB — HEMOGLOBIN A1C
Hgb A1c MFr Bld: 5.7 % of total Hgb — ABNORMAL HIGH (ref <5.7)
Mean Plasma Glucose: 117 mg/dL
eAG (mmol/L): 6.5 mmol/L

## 2021-07-02 LAB — COMPLETE METABOLIC PANEL WITH GFR
AG Ratio: 1.6 (calc) (ref 1.0–2.5)
ALT: 16 U/L (ref 9–46)
AST: 12 U/L (ref 10–35)
Albumin: 4.4 g/dL (ref 3.6–5.1)
Alkaline phosphatase (APISO): 112 U/L (ref 35–144)
BUN: 15 mg/dL (ref 7–25)
CO2: 26 mmol/L (ref 20–32)
Calcium: 9.2 mg/dL (ref 8.6–10.3)
Chloride: 104 mmol/L (ref 98–110)
Creat: 0.95 mg/dL (ref 0.70–1.35)
Globulin: 2.7 g/dL (calc) (ref 1.9–3.7)
Glucose, Bld: 98 mg/dL (ref 65–99)
Potassium: 4.1 mmol/L (ref 3.5–5.3)
Sodium: 139 mmol/L (ref 135–146)
Total Bilirubin: 0.3 mg/dL (ref 0.2–1.2)
Total Protein: 7.1 g/dL (ref 6.1–8.1)
eGFR: 92 mL/min/{1.73_m2} (ref >=60)

## 2021-07-02 LAB — LIPID PANEL
Cholesterol: 204 mg/dL — ABNORMAL HIGH (ref <200)
HDL: 52 mg/dL (ref >=40)
LDL Cholesterol (Calc): 118 mg/dL (calc) — ABNORMAL HIGH
Non-HDL Cholesterol (Calc): 152 mg/dL (calc) — ABNORMAL HIGH (ref <130)
Total CHOL/HDL Ratio: 3.9 (calc) (ref <5.0)
Triglycerides: 225 mg/dL — ABNORMAL HIGH (ref <150)

## 2021-08-03 DIAGNOSIS — M47819 Spondylosis without myelopathy or radiculopathy, site unspecified: Secondary | ICD-10-CM | POA: Diagnosis not present

## 2021-08-16 ENCOUNTER — Telehealth: Payer: Self-pay

## 2021-08-16 NOTE — Telephone Encounter (Signed)
Rose, pt's spouse, lmovm requesting call back to schedule procedure.   There is not a DPR on file giving permission to speak to her

## 2021-08-30 ENCOUNTER — Telehealth: Payer: Self-pay | Admitting: Gastroenterology

## 2021-08-30 ENCOUNTER — Telehealth: Payer: Self-pay

## 2021-08-30 NOTE — Telephone Encounter (Signed)
Called to reschedule colonoscopy no answer left voicemail for a call back ?

## 2021-08-30 NOTE — Telephone Encounter (Signed)
Pt is calling to resched a colonoscpy  ?

## 2021-11-18 DIAGNOSIS — R7303 Prediabetes: Secondary | ICD-10-CM | POA: Diagnosis not present

## 2021-11-18 DIAGNOSIS — E559 Vitamin D deficiency, unspecified: Secondary | ICD-10-CM | POA: Diagnosis not present

## 2021-11-18 DIAGNOSIS — J449 Chronic obstructive pulmonary disease, unspecified: Secondary | ICD-10-CM | POA: Diagnosis not present

## 2021-11-18 DIAGNOSIS — I10 Essential (primary) hypertension: Secondary | ICD-10-CM | POA: Diagnosis not present

## 2021-11-18 DIAGNOSIS — R5383 Other fatigue: Secondary | ICD-10-CM | POA: Diagnosis not present

## 2021-11-18 DIAGNOSIS — D519 Vitamin B12 deficiency anemia, unspecified: Secondary | ICD-10-CM | POA: Diagnosis not present

## 2021-11-18 DIAGNOSIS — E782 Mixed hyperlipidemia: Secondary | ICD-10-CM | POA: Diagnosis not present

## 2021-12-02 ENCOUNTER — Ambulatory Visit: Payer: Medicare HMO

## 2021-12-10 ENCOUNTER — Telehealth: Payer: Self-pay | Admitting: Family Medicine

## 2021-12-10 NOTE — Telephone Encounter (Signed)
Called pt wife to advise we cannot  give any medical information to her due to him not having a DPR signed giving her authorization.

## 2021-12-10 NOTE — Telephone Encounter (Signed)
Copied from [place] [id number]. Topic: General - Other >> Dec 10, 2021 12:48 PM Leitha Schuller wrote: Reason for CRM: Patient's spouse requesting a med list of patient's medication, she states she does not have access to patient's mychart  Please assist further

## 2021-12-29 ENCOUNTER — Ambulatory Visit: Payer: Medicare HMO | Admitting: Nurse Practitioner

## 2021-12-29 NOTE — Progress Notes (Deleted)
There were no vitals taken for this visit.   Subjective:    Patient ID: Franklin Richardson, male    DOB: 05/31/1961, 61 y.o.   MRN: [id number]  HPI: Franklin Richardson is a 61 y.o. male  No chief complaint on file. HTN: Patient reports he does not check his blood pressure at home.  However he does deny any shortness of breath, chest pain, blurred vision or headaches.  His blood pressure today is  ***.  He does currently take amlodipine 10 mg, atenolol 25 mg, spironolactone 25 mg twice daily and hydrochlorothiazide 12.5 mg.     Hyperlipidemia: Patient reports he does take atorvastatin 20 mg daily.  He denies any myalgia.  His last LDL was 118 on 07/01/2021.   GERD: Patient reports that he has not had any episodes of acid reflux in quite a while.  He is no longer taking omeprazole.   Prediabetes: Patient's last A1c was 5.7 on 07/01/2021.  He denies any polyphagia, polyuria or polydipsia.    Relevant past medical, surgical, family and social history reviewed and updated as indicated. Interim medical history since our last visit reviewed. Allergies and medications reviewed and updated.  Review of Systems  Per HPI unless specifically indicated above     Objective:    There were no vitals taken for this visit.  Wt Readings from Last 3 Encounters:  07/01/21 204 lb 1.6 oz (92.6 kg)  12/01/20 203 lb 6.4 oz (92.3 kg)  12/05/19 210 lb 6.9 oz (95.5 kg)    Physical Exam  Results for orders placed or performed in visit on 07/01/21  Lipid panel  Result Value Ref Range   Cholesterol 204 (H) <200 mg/dL   HDL 52 > OR = 40 mg/dL   Triglycerides 225 (H) <150 mg/dL   LDL Cholesterol (Calc) 118 (H) mg/dL (calc)   Total CHOL/HDL Ratio 3.9 <5.0 (calc)   Non-HDL Cholesterol (Calc) 152 (H) <130 mg/dL (calc)  CBC with Differential/Platelet  Result Value Ref Range   WBC 6.8 3.8 - 10.8 Thousand/uL   RBC 4.58 4.20 - 5.80 Million/uL   Hemoglobin 13.6 13.2 - 17.1 g/dL   HCT 39.9 38.5 - 50.0 %   MCV 87.1 80.0 -  100.0 fL   MCH 29.7 27.0 - 33.0 pg   MCHC 34.1 32.0 - 36.0 g/dL   RDW 12.9 11.0 - 15.0 %   Platelets 288 140 - 400 Thousand/uL   MPV 10.5 7.5 - 12.5 fL   Neutro Abs 3,686 1,500 - 7,800 cells/uL   Lymphs Abs 2,496 850 - 3,900 cells/uL   Absolute Monocytes 469 200 - 950 cells/uL   Eosinophils Absolute 122 15 - 500 cells/uL   Basophils Absolute 27 0 - 200 cells/uL   Neutrophils Relative % 54.2 %   Total Lymphocyte 36.7 %   Monocytes Relative 6.9 %   Eosinophils Relative 1.8 %   Basophils Relative 0.4 %  COMPLETE METABOLIC PANEL WITH GFR  Result Value Ref Range   Glucose, Bld 98 65 - 99 mg/dL   BUN 15 7 - 25 mg/dL   Creat 0.95 0.70 - 1.35 mg/dL   eGFR 92 > OR = 60 mL/min/1.57m   BUN/Creatinine Ratio NOT APPLICABLE 6 - 22 (calc)   Sodium 139 135 - 146 mmol/L   Potassium 4.1 3.5 - 5.3 mmol/L   Chloride 104 98 - 110 mmol/L   CO2 26 20 - 32 mmol/L   Calcium 9.2 8.6 - 10.3 mg/dL   Total  Protein 7.1 6.1 - 8.1 g/dL   Albumin 4.4 3.6 - 5.1 g/dL   Globulin 2.7 1.9 - 3.7 g/dL (calc)   AG Ratio 1.6 1.0 - 2.5 (calc)   Total Bilirubin 0.3 0.2 - 1.2 mg/dL   Alkaline phosphatase (APISO) 112 35 - 144 U/L   AST 12 10 - 35 U/L   ALT 16 9 - 46 U/L  Hemoglobin A1c  Result Value Ref Range   Hgb A1c MFr Bld 5.7 (H) <5.7 % of total Hgb   Mean Plasma Glucose 117 mg/dL   eAG (mmol/L) 6.5 mmol/L      Assessment & Plan:   Problem List Items Addressed This Visit   None    Follow up plan: No follow-ups on file.

## 2022-06-10 DIAGNOSIS — M029 Reactive arthropathy, unspecified: Secondary | ICD-10-CM | POA: Diagnosis not present

## 2022-06-10 DIAGNOSIS — I10 Essential (primary) hypertension: Secondary | ICD-10-CM | POA: Diagnosis not present

## 2022-06-10 DIAGNOSIS — E785 Hyperlipidemia, unspecified: Secondary | ICD-10-CM | POA: Diagnosis not present

## 2022-06-10 DIAGNOSIS — Z79620 Long term (current) use of immunosuppressive biologic: Secondary | ICD-10-CM | POA: Diagnosis not present

## 2022-06-10 DIAGNOSIS — Z8249 Family history of ischemic heart disease and other diseases of the circulatory system: Secondary | ICD-10-CM | POA: Diagnosis not present

## 2022-06-10 DIAGNOSIS — D84821 Immunodeficiency due to drugs: Secondary | ICD-10-CM | POA: Diagnosis not present

## 2022-06-10 DIAGNOSIS — Z808 Family history of malignant neoplasm of other organs or systems: Secondary | ICD-10-CM | POA: Diagnosis not present

## 2022-06-10 DIAGNOSIS — Z833 Family history of diabetes mellitus: Secondary | ICD-10-CM | POA: Diagnosis not present

## 2022-06-10 DIAGNOSIS — Z008 Encounter for other general examination: Secondary | ICD-10-CM | POA: Diagnosis not present

## 2022-06-10 DIAGNOSIS — Z800 Family history of malignant neoplasm of digestive organs: Secondary | ICD-10-CM | POA: Diagnosis not present

## 2022-07-01 DIAGNOSIS — Z23 Encounter for immunization: Secondary | ICD-10-CM | POA: Diagnosis not present

## 2022-07-07 ENCOUNTER — Emergency Department: Payer: Medicare HMO

## 2022-07-07 ENCOUNTER — Emergency Department
Admission: EM | Admit: 2022-07-07 | Discharge: 2022-07-07 | Disposition: A | Discharge status: Home / Self Care | Payer: Medicare HMO | Attending: Emergency Medicine | Admitting: Emergency Medicine

## 2022-07-07 ENCOUNTER — Other Ambulatory Visit: Payer: Self-pay

## 2022-07-07 ENCOUNTER — Encounter: Payer: Self-pay | Admitting: Emergency Medicine

## 2022-07-07 DIAGNOSIS — E119 Type 2 diabetes mellitus without complications: Secondary | ICD-10-CM | POA: Insufficient documentation

## 2022-07-07 DIAGNOSIS — R42 Dizziness and giddiness: Secondary | ICD-10-CM | POA: Diagnosis not present

## 2022-07-07 DIAGNOSIS — T5891XA Toxic effect of carbon monoxide from unspecified source, accidental (unintentional), initial encounter: Secondary | ICD-10-CM | POA: Diagnosis not present

## 2022-07-07 DIAGNOSIS — I10 Essential (primary) hypertension: Secondary | ICD-10-CM | POA: Insufficient documentation

## 2022-07-07 DIAGNOSIS — R0902 Hypoxemia: Secondary | ICD-10-CM | POA: Diagnosis not present

## 2022-07-07 DIAGNOSIS — R0602 Shortness of breath: Secondary | ICD-10-CM | POA: Diagnosis not present

## 2022-07-07 DIAGNOSIS — M25512 Pain in left shoulder: Secondary | ICD-10-CM | POA: Insufficient documentation

## 2022-07-07 DIAGNOSIS — R069 Unspecified abnormalities of breathing: Secondary | ICD-10-CM | POA: Diagnosis not present

## 2022-07-07 LAB — COOXEMETRY PANEL
Carboxyhemoglobin: 20.4 % (ref 0.5–1.5)
Carboxyhemoglobin: 7.1 % (ref 0.5–1.5)
Methemoglobin: 1.3 % (ref 0.0–1.5)
Methemoglobin: <0.7 % (ref 0.0–1.5)
O2 Saturation: 43.3 %
O2 Saturation: 86.3 %
Total hemoglobin: 14.2 g/dL (ref 12.0–16.0)
Total hemoglobin: 14.1 g/dL (ref 12.0–16.0)
Total oxygen content: 33.9 %
Total oxygen content: 80.2 %

## 2022-07-07 LAB — COMPREHENSIVE METABOLIC PANEL
ALT: 28 U/L (ref 0–44)
AST: 22 U/L (ref 15–41)
Albumin: 4.5 g/dL (ref 3.5–5.0)
Alkaline Phosphatase: 127 U/L — ABNORMAL HIGH (ref 38–126)
Anion gap: 13 (ref 5–15)
BUN: 16 mg/dL (ref 8–23)
CO2: 23 mmol/L (ref 22–32)
Calcium: 8.8 mg/dL — ABNORMAL LOW (ref 8.9–10.3)
Chloride: 99 mmol/L (ref 98–111)
Creatinine, Ser: 0.86 mg/dL (ref 0.61–1.24)
GFR, Estimated: >60 mL/min (ref >60)
Glucose, Bld: 114 mg/dL — ABNORMAL HIGH (ref 70–99)
Potassium: 3.9 mmol/L (ref 3.5–5.1)
Sodium: 135 mmol/L (ref 135–145)
Total Bilirubin: 0.6 mg/dL (ref 0.3–1.2)
Total Protein: 8.1 g/dL (ref 6.5–8.1)

## 2022-07-07 LAB — CBC WITH DIFFERENTIAL/PLATELET
Abs Immature Granulocytes: 0.02 10*3/uL (ref 0.00–0.07)
Basophils Absolute: 0 10*3/uL (ref 0.0–0.1)
Basophils Relative: 1 %
Eosinophils Absolute: 0.1 10*3/uL (ref 0.0–0.5)
Eosinophils Relative: 2 %
HCT: 44.2 % (ref 39.0–52.0)
Hemoglobin: 14.7 g/dL (ref 13.0–17.0)
Immature Granulocytes: 0 %
Lymphocytes Relative: 29 %
Lymphs Abs: 1.8 10*3/uL (ref 0.7–4.0)
MCH: 29.2 pg (ref 26.0–34.0)
MCHC: 33.3 g/dL (ref 30.0–36.0)
MCV: 87.9 fL (ref 80.0–100.0)
Monocytes Absolute: 0.5 10*3/uL (ref 0.1–1.0)
Monocytes Relative: 8 %
Neutro Abs: 3.7 10*3/uL (ref 1.7–7.7)
Neutrophils Relative %: 60 %
Platelets: 276 10*3/uL (ref 150–400)
RBC: 5.03 MIL/uL (ref 4.22–5.81)
RDW: 13 % (ref 11.5–15.5)
WBC: 6 10*3/uL (ref 4.0–10.5)
nRBC: 0 % (ref 0.0–0.2)

## 2022-07-07 LAB — TROPONIN I (HIGH SENSITIVITY): Troponin I (High Sensitivity): 6 ng/L (ref <18)

## 2022-07-07 MED ORDER — LACTATED RINGERS IV BOLUS
1000.0000 mL | Freq: Once | INTRAVENOUS | Status: AC
  Administered 2022-07-07: 1000 mL via INTRAVENOUS

## 2022-07-07 NOTE — ED Notes (Signed)
Patient ambulated in room without difficulty. O2 stayed in upper 90's. Quentin Cornwall, MD aware.

## 2022-07-07 NOTE — ED Notes (Addendum)
Patient placed on non-rebreather at 15L

## 2022-07-07 NOTE — ED Notes (Signed)
Amy from RT called back critical carboxyhemoglobin level of 7.  Dr. Quentin Cornwall has been notified.  He plans to call poison control when the results populate on computer.  Plan of care to remain oxygen by mask at this time.

## 2022-07-07 NOTE — ED Triage Notes (Signed)
Patient to ED via [facility] from home for SOB/ possible carbon monoxide poisoning. Patient was working on his car for approx 30 minutes with garage shut. Pt also had a space heater going during this time. EMS placed patient on 4L [place].  98% 4L 65 HR 137/79

## 2022-07-07 NOTE — ED Provider Notes (Signed)
Patient received in signout from Dr. Charna Archer pending further observation and repeat carboxy hemoglobin testing.  Repeat significantly downtrending.  Patient currently asymptomatic.  I consulted with poison control who stated no further observation period required.  Patient does appear stable and appropriate for outpatient follow-up.   Merlyn Lot, MD 07/07/22 1710

## 2022-07-07 NOTE — ED Notes (Addendum)
Amy from respiratory called with critical value.  [place] Hgb is 20.  Dr. Charna Archer notified.  Patient to be placed on non-rebreather.

## 2022-07-07 NOTE — ED Notes (Signed)
At the request of Dr. Charna Archer, poison control has been contacted.  Franklin Richardson agreed with current course of treatment.  She added that the patient should have a neuro assessment done, and left on non-rebreather at high flow for 4 hours and have labs repeated.   She said poison control will follow up at 1730.

## 2022-07-07 NOTE — ED Provider Notes (Signed)
[facility] Provider Note    Event Date/Time   First MD Initiated Contact with Patient 07/07/22 1147     (approximate)   History   Chief Complaint Shortness of Breath  HPI  Franklin Richardson is a 62 y.o. male with past medical history of hypertension, hyperlipidemia, diabetes, Reiter's disease, and TBI who presents to the ED for shortness of breath.  Patient reports that he was feeling fine when he woke up this morning, then spent about 30 minutes working on his running car in the garage while the garage door was closed.  He states that while doing so he began to feel lightheaded and dizzy, like he was going to pass out.  He also reports some difficulty breathing as well as pain in his left shoulder.  He denies any recent fevers or cough, has not had any nausea, vomiting, or diarrhea.  He reports feeling better since leaving the room with resolution of his shoulder discomfort.  He had been placed on 4 L nasal cannula by EMS for comfort.     Physical Exam   Triage Vital Signs: ED Triage Vitals  Enc Vitals Group     BP 07/07/22 1147 135/85     Pulse Rate 07/07/22 1143 60     Resp 07/07/22 1143 16     Temp 07/07/22 1143 97.8 F (36.6 C)     Temp Source 07/07/22 1143 Oral     SpO2 07/07/22 1146 100 %     Weight --      Height --      Head Circumference --      Peak Flow --      Pain Score 07/07/22 1143 0     Pain Loc --      Pain Edu? --      Excl. in [place]? --     Most recent vital signs: Vitals:   07/07/22 1345 07/07/22 1400  BP:  (!) 165/126  Pulse: 60 69  Resp: 11 (!) 24  Temp:    SpO2: 97% 100%    Constitutional: Alert and oriented. Eyes: Conjunctivae are normal. Head: Atraumatic. Nose: No congestion/rhinnorhea. Mouth/Throat: Mucous membranes are moist.  Cardiovascular: Normal rate, regular rhythm. Grossly normal heart sounds.  2+ radial pulses bilaterally. Respiratory: Normal respiratory effort.  No retractions. Lungs  CTAB. Gastrointestinal: Soft and nontender. No distention. Musculoskeletal: No lower extremity tenderness nor edema.  Neurologic:  Normal speech and language. No gross focal neurologic deficits are appreciated.    ED Results / Procedures / Treatments   Labs (all labs ordered are listed, but only abnormal results are displayed) Labs Reviewed  COMPREHENSIVE METABOLIC PANEL - Abnormal; Notable for the following components:      Result Value   Glucose, Bld 114 (*)    Calcium 8.8 (*)    Alkaline Phosphatase 127 (*)    All other components within normal limits  COOXEMETRY PANEL - Abnormal; Notable for the following components:   Carboxyhemoglobin 20.4 (*)    All other components within normal limits  BLOOD GAS, VENOUS - Abnormal; Notable for the following components:   pO2, Ven <31 (*)    Bicarbonate 30.2 (*)    Acid-Base Excess 3.9 (*)    All other components within normal limits  CBC WITH DIFFERENTIAL/PLATELET  COOXEMETRY PANEL  TROPONIN I (HIGH SENSITIVITY)  TROPONIN I (HIGH SENSITIVITY)     EKG  ED ECG REPORT I, Blake Divine, the attending physician, personally viewed and interpreted this ECG.  Date: 07/07/2022  EKG Time: 11:45  Rate: 61  Rhythm: normal sinus rhythm  Axis: Normal  Intervals:none  ST&T Change: None  RADIOLOGY CXR reviewed and interpreted by me with no infiltrate, edema, or effusion.  PROCEDURES:  Critical Care performed: Yes, see critical care procedure note(s)  .Critical Care  Performed by: Blake Divine, MD Authorized by: Blake Divine, MD   Critical care provider statement:    Critical care time (minutes):  30   Critical care time was exclusive of:  Separately billable procedures and treating other patients and teaching time   Critical care was necessary to treat or prevent imminent or life-threatening deterioration of the following conditions:  Toxidrome   Critical care was time spent personally by me on the following activities:   Development of treatment plan with patient or surrogate, discussions with consultants, evaluation of patient's response to treatment, examination of patient, ordering and review of laboratory studies, ordering and review of radiographic studies, ordering and performing treatments and interventions, pulse oximetry, re-evaluation of patient's condition and review of old charts   I assumed direction of critical care for this patient from another provider in my specialty: no      MEDICATIONS ORDERED IN ED: Medications  lactated ringers bolus 1,000 mL (1,000 mLs Intravenous New Bag/Given 07/07/22 1221)     IMPRESSION / MDM / [place] / ED COURSE  I reviewed the triage vital signs and the nursing notes.                              62 y.o. male with past medical history of hypertension, hyperlipidemia, diabetes, Reiter's disease, and TBI who presents to the ED with lightheadedness, shortness of breath, and shoulder pain while working on a running car in a close garage.  Patient's presentation is most consistent with acute presentation with potential threat to life or bodily function.  Differential diagnosis includes, but is not limited to, carbon oxide poisoning, hypercapnia, ACS, PE, pneumonia, anemia, electrolyte abnormality, AKI.  Patient nontoxic-appearing and in no acute distress, vital signs are unremarkable.  He is not in any respiratory distress, had been placed on 4 L nasal cannula by EMS and currently maintaining oxygen saturations at 100%.  EKG shows no evidence of arrhythmia or ischemia, we will further assess with labs including Co. oximetry and VBG, also check chest x-ray.  Labs remarkable for elevated carboxyhemoglobin at greater than 20, patient placed on nonrebreather.  Chest x-ray is unremarkable and remainder of labs are reassuring with no significant anemia, leukocytosis, i-STAT abnormality, or AKI.  Findings reviewed with poison control, who recommends 4 hours on  nonrebreather with repeat Co. oximetry.  Patient turned over to oncoming provider pending repeat Co. oximetry and reassessment.      FINAL CLINICAL IMPRESSION(S) / ED DIAGNOSES   Final diagnoses:  Toxic effect of carbon monoxide, unintentional, initial encounter     Rx / DC Orders   ED Discharge Orders     None        Note:  This document was prepared using Dragon voice recognition software and may include unintentional dictation errors.   Blake Divine, MD 07/07/22 [phone]

## 2022-07-11 LAB — BLOOD GAS, VENOUS
Acid-Base Excess: 3.9 mmol/L — ABNORMAL HIGH (ref 0.0–2.0)
Bicarbonate: 30.2 mmol/L — ABNORMAL HIGH (ref 20.0–28.0)
O2 Saturation: 44.1 %
Patient temperature: 37
pCO2, Ven: 51 mmHg (ref 44–60)
pH, Ven: 7.38 (ref 7.25–7.43)

## 2022-07-27 DIAGNOSIS — H903 Sensorineural hearing loss, bilateral: Secondary | ICD-10-CM | POA: Diagnosis not present

## 2022-07-28 ENCOUNTER — Ambulatory Visit: Payer: Medicare HMO

## 2022-08-08 ENCOUNTER — Telehealth: Payer: Self-pay | Admitting: Family Medicine

## 2022-08-08 NOTE — Telephone Encounter (Signed)
LVM informing patient to rtn my call to re-schedule his appt he missed on 07/28/22 with [facility]

## 2022-08-17 DIAGNOSIS — H903 Sensorineural hearing loss, bilateral: Secondary | ICD-10-CM | POA: Diagnosis not present

## 2022-09-11 DIAGNOSIS — M7918 Myalgia, other site: Secondary | ICD-10-CM | POA: Diagnosis not present

## 2022-09-19 ENCOUNTER — Telehealth: Payer: Self-pay | Admitting: Family Medicine

## 2022-09-19 DIAGNOSIS — G8929 Other chronic pain: Secondary | ICD-10-CM | POA: Diagnosis not present

## 2022-09-19 DIAGNOSIS — M1711 Unilateral primary osteoarthritis, right knee: Secondary | ICD-10-CM | POA: Diagnosis not present

## 2022-09-19 DIAGNOSIS — M1611 Unilateral primary osteoarthritis, right hip: Secondary | ICD-10-CM | POA: Diagnosis not present

## 2022-09-19 DIAGNOSIS — M25561 Pain in right knee: Secondary | ICD-10-CM | POA: Diagnosis not present

## 2022-09-19 DIAGNOSIS — M25551 Pain in right hip: Secondary | ICD-10-CM | POA: Diagnosis not present

## 2022-09-19 NOTE — Telephone Encounter (Signed)
Called patient to schedule Medicare Annual Wellness Visit (AWV). Left message for patient to call back and schedule Medicare Annual Wellness Visit (AWV).  Last date of AWV: 11/30/2020  Please schedule an appointment at any time with [facility] .  If any questions, please contact me at [phone].  Thank you ,  [place] Direct Dial: [phone]

## 2022-09-20 ENCOUNTER — Telehealth: Payer: Self-pay | Admitting: Family Medicine

## 2022-09-20 NOTE — Telephone Encounter (Signed)
Contacted Franklin Richardson to schedule their annual wellness visit. Patient declined to schedule AWV at this time. Transferred care   [place] Direct Dial: [phone]

## 2022-11-28 ENCOUNTER — Other Ambulatory Visit: Payer: Self-pay | Admitting: Family

## 2022-11-28 DIAGNOSIS — E782 Mixed hyperlipidemia: Secondary | ICD-10-CM

## 2022-11-28 DIAGNOSIS — I10 Essential (primary) hypertension: Secondary | ICD-10-CM

## 2022-12-04 DIAGNOSIS — W57XXXA Bitten or stung by nonvenomous insect and other nonvenomous arthropods, initial encounter: Secondary | ICD-10-CM | POA: Diagnosis not present

## 2022-12-04 DIAGNOSIS — S80862A Insect bite (nonvenomous), left lower leg, initial encounter: Secondary | ICD-10-CM | POA: Diagnosis not present

## 2022-12-04 DIAGNOSIS — L089 Local infection of the skin and subcutaneous tissue, unspecified: Secondary | ICD-10-CM | POA: Diagnosis not present

## 2023-03-15 DIAGNOSIS — Z008 Encounter for other general examination: Secondary | ICD-10-CM | POA: Diagnosis not present

## 2023-03-27 DIAGNOSIS — H9212 Otorrhea, left ear: Secondary | ICD-10-CM | POA: Diagnosis not present

## 2023-05-20 ENCOUNTER — Other Ambulatory Visit: Payer: Self-pay | Admitting: Family

## 2023-05-20 DIAGNOSIS — I10 Essential (primary) hypertension: Secondary | ICD-10-CM

## 2023-05-20 DIAGNOSIS — E782 Mixed hyperlipidemia: Secondary | ICD-10-CM

## 2023-07-03 DIAGNOSIS — Z111 Encounter for screening for respiratory tuberculosis: Secondary | ICD-10-CM | POA: Diagnosis not present

## 2023-07-03 DIAGNOSIS — Z7960 Long term (current) use of unspecified immunomodulators and immunosuppressants: Secondary | ICD-10-CM | POA: Diagnosis not present

## 2023-07-03 DIAGNOSIS — M459 Ankylosing spondylitis of unspecified sites in spine: Secondary | ICD-10-CM | POA: Diagnosis not present

## 2023-07-13 ENCOUNTER — Telehealth: Payer: Self-pay

## 2023-07-13 NOTE — Telephone Encounter (Signed)
The patient wife called in to schedule her husband a EGD. I inform her that we need a referral before scheduling him.

## 2023-07-18 ENCOUNTER — Telehealth: Payer: Self-pay | Admitting: Family

## 2023-07-18 NOTE — Telephone Encounter (Signed)
Patient needs his esophagus stretched and needs a new referral to Dr. Tobi Bastos at [facility]. Advised them that he needs an appt with [place] first then we can send referral.

## 2023-07-19 ENCOUNTER — Ambulatory Visit (INDEPENDENT_AMBULATORY_CARE_PROVIDER_SITE_OTHER): Payer: Medicare HMO | Admitting: Family

## 2023-07-19 VITALS — BP 137/78 | HR 55 | Ht 72.0 in | Wt 209.0 lb

## 2023-07-19 DIAGNOSIS — M450 Ankylosing spondylitis of multiple sites in spine: Secondary | ICD-10-CM | POA: Diagnosis not present

## 2023-07-19 DIAGNOSIS — M0608 Rheumatoid arthritis without rheumatoid factor, vertebrae: Secondary | ICD-10-CM | POA: Diagnosis not present

## 2023-07-19 DIAGNOSIS — Z23 Encounter for immunization: Secondary | ICD-10-CM

## 2023-07-19 DIAGNOSIS — Z1211 Encounter for screening for malignant neoplasm of colon: Secondary | ICD-10-CM | POA: Diagnosis not present

## 2023-07-19 DIAGNOSIS — M47819 Spondylosis without myelopathy or radiculopathy, site unspecified: Secondary | ICD-10-CM
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
## 2023-07-22 ENCOUNTER — Encounter: Payer: Self-pay | Admitting: Family

## 2023-07-22 NOTE — Assessment & Plan Note (Signed)
Patient is seen by Rheumatology, who manage this condition.  He is well controlled with current therapy.   Will defer to them for further changes to plan of care.

## 2023-07-25 ENCOUNTER — Telehealth: Payer: Self-pay | Admitting: Family

## 2023-07-25 NOTE — Telephone Encounter (Signed)
Referral was sent to [facility] - should go to [facility] (Dr. Tobi Bastos). Please resend.

## 2023-07-28 ENCOUNTER — Telehealth: Payer: Self-pay

## 2023-07-28 NOTE — Telephone Encounter (Signed)
Patient was contacted this morning to discuss scheduling his repeat colonoscopy.  Colonoscopy was due for repeat last year with Dr. Tobi Bastos. Last colonoscopy w/EGD 12/05/19. Last office visit for Dysphagia 11/20/19.  Patient has requested to have his esophagus stretched because he has been having problems swallowing.  Informed patient that I will discuss his request with Dr. Tobi Bastos to see if we can move forward with scheduling both procedures or he will need an office visit.  Colonoscopy for history of colon polyps and poor prep.  EGD for Dysphagia.   Thanks,  [place], [place]

## 2023-08-31 ENCOUNTER — Ambulatory Visit: Payer: Medicare HMO | Admitting: Gastroenterology
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
## 2023-11-02 ENCOUNTER — Telehealth: Payer: Self-pay | Admitting: Gastroenterology

## 2023-11-02 NOTE — Telephone Encounter (Signed)
 The patient and his wife Jolly Needle) called in to schedule an office visit with Dr. Antony Baumgartner. I informed them that Dr. Antony Baumgartner doesn't have anything available.

## 2023-11-16 ENCOUNTER — Other Ambulatory Visit: Payer: Self-pay | Admitting: Family

## 2023-11-16 DIAGNOSIS — I10 Essential (primary) hypertension: Secondary | ICD-10-CM

## 2023-11-16 DIAGNOSIS — E782 Mixed hyperlipidemia: Secondary | ICD-10-CM

## 2024-01-15 DIAGNOSIS — H20021 Recurrent acute iridocyclitis, right eye: Secondary | ICD-10-CM | POA: Diagnosis not present

## 2024-01-18 DIAGNOSIS — H20021 Recurrent acute iridocyclitis, right eye: Secondary | ICD-10-CM | POA: Diagnosis not present

## 2024-02-05 DIAGNOSIS — H20021 Recurrent acute iridocyclitis, right eye: Secondary | ICD-10-CM | POA: Diagnosis not present

## 2024-02-13 DIAGNOSIS — H903 Sensorineural hearing loss, bilateral: Secondary | ICD-10-CM | POA: Diagnosis not present

## 2024-02-13 DIAGNOSIS — H6122 Impacted cerumen, left ear: Secondary | ICD-10-CM | POA: Diagnosis not present

## 2024-02-27 DIAGNOSIS — I10 Essential (primary) hypertension: Secondary | ICD-10-CM | POA: Diagnosis not present

## 2024-02-27 DIAGNOSIS — M47819 Spondylosis without myelopathy or radiculopathy, site unspecified: Secondary | ICD-10-CM | POA: Diagnosis not present

## 2024-02-27 DIAGNOSIS — Z9889 Other specified postprocedural states: Secondary | ICD-10-CM | POA: Diagnosis not present

## 2024-02-27 DIAGNOSIS — E785 Hyperlipidemia, unspecified: Secondary | ICD-10-CM | POA: Diagnosis not present

## 2024-03-05 ENCOUNTER — Emergency Department

## 2024-03-05 ENCOUNTER — Other Ambulatory Visit: Payer: Self-pay

## 2024-03-05 DIAGNOSIS — Z5321 Procedure and treatment not carried out due to patient leaving prior to being seen by health care provider: Secondary | ICD-10-CM | POA: Diagnosis not present

## 2024-03-05 DIAGNOSIS — R531 Weakness: Secondary | ICD-10-CM | POA: Diagnosis not present

## 2024-03-05 DIAGNOSIS — R0602 Shortness of breath: Secondary | ICD-10-CM | POA: Insufficient documentation

## 2024-03-05 DIAGNOSIS — R079 Chest pain, unspecified: Secondary | ICD-10-CM | POA: Insufficient documentation

## 2024-03-05 DIAGNOSIS — Z743 Need for continuous supervision: Secondary | ICD-10-CM | POA: Diagnosis not present

## 2024-03-05 DIAGNOSIS — I10 Essential (primary) hypertension: Secondary | ICD-10-CM | POA: Diagnosis not present

## 2024-03-05 LAB — COMPREHENSIVE METABOLIC PANEL WITH GFR
ALT: 18 U/L (ref 0–44)
AST: 18 U/L (ref 15–41)
Albumin: 4.2 g/dL (ref 3.5–5.0)
Alkaline Phosphatase: 130 U/L — ABNORMAL HIGH (ref 38–126)
Anion gap: 9 (ref 5–15)
BUN: 12 mg/dL (ref 8–23)
CO2: 27 mmol/L (ref 22–32)
Calcium: 9 mg/dL (ref 8.9–10.3)
Chloride: 104 mmol/L (ref 98–111)
Creatinine, Ser: 0.99 mg/dL (ref 0.61–1.24)
GFR, Estimated: >60 mL/min (ref >60)
Glucose, Bld: 117 mg/dL — ABNORMAL HIGH (ref 70–99)
Potassium: 3.5 mmol/L (ref 3.5–5.1)
Sodium: 140 mmol/L (ref 135–145)
Total Bilirubin: 0.5 mg/dL (ref 0.0–1.2)
Total Protein: 7.7 g/dL (ref 6.5–8.1)

## 2024-03-05 LAB — URINALYSIS, ROUTINE W REFLEX MICROSCOPIC
Bilirubin Urine: NEGATIVE
Glucose, UA: NEGATIVE mg/dL
Hgb urine dipstick: NEGATIVE
Ketones, ur: NEGATIVE mg/dL
Nitrite: NEGATIVE
Protein, ur: NEGATIVE mg/dL
Specific Gravity, Urine: 1.017 (ref 1.005–1.030)
pH: 5 (ref 5.0–8.0)

## 2024-03-05 LAB — CBC
HCT: 43.2 % (ref 39.0–52.0)
Hemoglobin: 14.4 g/dL (ref 13.0–17.0)
MCH: 29.3 pg (ref 26.0–34.0)
MCHC: 33.3 g/dL (ref 30.0–36.0)
MCV: 88 fL (ref 80.0–100.0)
Platelets: 246 K/uL (ref 150–400)
RBC: 4.91 MIL/uL (ref 4.22–5.81)
RDW: 12.7 % (ref 11.5–15.5)
WBC: 7.4 K/uL (ref 4.0–10.5)
nRBC: 0 % (ref 0.0–0.2)

## 2024-03-05 LAB — TROPONIN I (HIGH SENSITIVITY)
Troponin I (High Sensitivity): 7 ng/L (ref <18)
Troponin I (High Sensitivity): 8 ng/L (ref <18)
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
## 2024-03-06 ENCOUNTER — Emergency Department: Admission: EM | Admit: 2024-03-06 | Discharge: 2024-03-06 | Discharge status: Against Medical Advice

## 2024-03-06 DIAGNOSIS — M1990 Unspecified osteoarthritis, unspecified site: Secondary | ICD-10-CM | POA: Diagnosis not present

## 2024-03-06 DIAGNOSIS — R55 Syncope and collapse: Secondary | ICD-10-CM | POA: Diagnosis not present

## 2024-03-06 DIAGNOSIS — R079 Chest pain, unspecified: Secondary | ICD-10-CM | POA: Diagnosis not present

## 2024-03-06 DIAGNOSIS — Z1152 Encounter for screening for COVID-19: Secondary | ICD-10-CM | POA: Diagnosis not present

## 2024-03-06 DIAGNOSIS — R001 Bradycardia, unspecified: Secondary | ICD-10-CM | POA: Diagnosis not present

## 2024-03-06 DIAGNOSIS — Z79899 Other long term (current) drug therapy: Secondary | ICD-10-CM | POA: Diagnosis not present

## 2024-03-06 DIAGNOSIS — I10 Essential (primary) hypertension: Secondary | ICD-10-CM | POA: Diagnosis not present

## 2024-03-06 DIAGNOSIS — E785 Hyperlipidemia, unspecified: Secondary | ICD-10-CM | POA: Diagnosis not present

## 2024-03-11 DIAGNOSIS — I480 Paroxysmal atrial fibrillation: Secondary | ICD-10-CM | POA: Diagnosis not present

## 2024-03-11 DIAGNOSIS — R0602 Shortness of breath: Secondary | ICD-10-CM | POA: Diagnosis not present

## 2024-03-11 DIAGNOSIS — R0789 Other chest pain: Secondary | ICD-10-CM | POA: Diagnosis not present

## 2024-03-11 DIAGNOSIS — E785 Hyperlipidemia, unspecified: Secondary | ICD-10-CM | POA: Diagnosis not present

## 2024-03-11 DIAGNOSIS — I10 Essential (primary) hypertension: Secondary | ICD-10-CM | POA: Diagnosis not present

## 2024-03-11 DIAGNOSIS — R9431 Abnormal electrocardiogram [ECG] [EKG]: Secondary | ICD-10-CM | POA: Diagnosis not present

## 2024-03-11 DIAGNOSIS — I701 Atherosclerosis of renal artery: Secondary | ICD-10-CM | POA: Diagnosis not present

## 2024-03-11 DIAGNOSIS — R7303 Prediabetes: Secondary | ICD-10-CM | POA: Diagnosis not present

## 2024-03-13 ENCOUNTER — Other Ambulatory Visit: Payer: Self-pay | Admitting: Internal Medicine

## 2024-03-13 ENCOUNTER — Encounter: Payer: Self-pay | Admitting: Internal Medicine

## 2024-03-13 DIAGNOSIS — I701 Atherosclerosis of renal artery: Secondary | ICD-10-CM

## 2024-03-13 DIAGNOSIS — R0789 Other chest pain: Secondary | ICD-10-CM

## 2024-03-14 ENCOUNTER — Other Ambulatory Visit: Payer: Self-pay | Admitting: Internal Medicine

## 2024-03-14 DIAGNOSIS — R9431 Abnormal electrocardiogram [ECG] [EKG]: Secondary | ICD-10-CM

## 2024-03-14 DIAGNOSIS — I10 Essential (primary) hypertension: Secondary | ICD-10-CM

## 2024-03-14 DIAGNOSIS — R0789 Other chest pain: Secondary | ICD-10-CM

## 2024-03-14 DIAGNOSIS — I701 Atherosclerosis of renal artery: Secondary | ICD-10-CM

## 2024-03-14 DIAGNOSIS — E785 Hyperlipidemia, unspecified: Secondary | ICD-10-CM

## 2024-03-14 DIAGNOSIS — R0602 Shortness of breath: Secondary | ICD-10-CM

## 2024-03-14 DIAGNOSIS — R079 Chest pain, unspecified: Secondary | ICD-10-CM | POA: Diagnosis not present

## 2024-03-18 ENCOUNTER — Other Ambulatory Visit: Payer: Self-pay | Admitting: Internal Medicine

## 2024-03-18 DIAGNOSIS — I10 Essential (primary) hypertension: Secondary | ICD-10-CM

## 2024-03-18 DIAGNOSIS — I701 Atherosclerosis of renal artery: Secondary | ICD-10-CM

## 2024-03-18 DIAGNOSIS — R0789 Other chest pain: Secondary | ICD-10-CM

## 2024-03-25 ENCOUNTER — Ambulatory Visit
Admission: RE | Admit: 2024-03-25 | Discharge: 2024-03-25 | Disposition: A | Discharge status: Home / Self Care | Source: Ambulatory Visit | Attending: Internal Medicine | Admitting: Internal Medicine

## 2024-03-25 ENCOUNTER — Other Ambulatory Visit

## 2024-03-25 DIAGNOSIS — R0602 Shortness of breath: Secondary | ICD-10-CM | POA: Diagnosis not present

## 2024-03-25 DIAGNOSIS — R0789 Other chest pain: Secondary | ICD-10-CM | POA: Insufficient documentation

## 2024-03-25 DIAGNOSIS — N400 Enlarged prostate without lower urinary tract symptoms: Secondary | ICD-10-CM | POA: Diagnosis not present

## 2024-03-25 DIAGNOSIS — I10 Essential (primary) hypertension: Secondary | ICD-10-CM | POA: Insufficient documentation

## 2024-03-25 DIAGNOSIS — I701 Atherosclerosis of renal artery: Secondary | ICD-10-CM | POA: Insufficient documentation

## 2024-04-01 ENCOUNTER — Ambulatory Visit
Admission: RE | Admit: 2024-04-01 | Discharge: 2024-04-01 | Disposition: A | Discharge status: Home / Self Care | Payer: Self-pay | Source: Ambulatory Visit | Attending: Internal Medicine | Admitting: Internal Medicine

## 2024-04-01 DIAGNOSIS — I10 Essential (primary) hypertension: Secondary | ICD-10-CM | POA: Diagnosis present

## 2024-04-01 DIAGNOSIS — E785 Hyperlipidemia, unspecified: Secondary | ICD-10-CM | POA: Diagnosis present

## 2024-04-01 DIAGNOSIS — R0789 Other chest pain: Secondary | ICD-10-CM | POA: Insufficient documentation

## 2024-04-01 DIAGNOSIS — R9431 Abnormal electrocardiogram [ECG] [EKG]: Secondary | ICD-10-CM | POA: Diagnosis present

## 2024-04-01 DIAGNOSIS — R0602 Shortness of breath: Secondary | ICD-10-CM | POA: Diagnosis present

## 2024-04-01 DIAGNOSIS — I701 Atherosclerosis of renal artery: Secondary | ICD-10-CM | POA: Diagnosis present

## 2024-04-08 DIAGNOSIS — G4733 Obstructive sleep apnea (adult) (pediatric): Secondary | ICD-10-CM | POA: Diagnosis not present

## 2024-04-08 DIAGNOSIS — I701 Atherosclerosis of renal artery: Secondary | ICD-10-CM | POA: Diagnosis not present

## 2024-04-08 DIAGNOSIS — E785 Hyperlipidemia, unspecified: Secondary | ICD-10-CM | POA: Diagnosis not present

## 2024-04-08 DIAGNOSIS — R7303 Prediabetes: Secondary | ICD-10-CM | POA: Diagnosis not present

## 2024-04-08 DIAGNOSIS — I10 Essential (primary) hypertension: Secondary | ICD-10-CM | POA: Diagnosis not present

## 2024-04-09 DIAGNOSIS — H6983 Other specified disorders of Eustachian tube, bilateral: Secondary | ICD-10-CM | POA: Diagnosis not present

## 2024-04-09 DIAGNOSIS — H903 Sensorineural hearing loss, bilateral: Secondary | ICD-10-CM | POA: Diagnosis not present

## 2024-04-26 ENCOUNTER — Ambulatory Visit: Admitting: Student in an Organized Health Care Education/Training Program

## 2024-05-14 ENCOUNTER — Other Ambulatory Visit: Payer: Self-pay | Admitting: Family

## 2024-05-14 DIAGNOSIS — I10 Essential (primary) hypertension: Secondary | ICD-10-CM

## 2024-05-14 DIAGNOSIS — E782 Mixed hyperlipidemia: Secondary | ICD-10-CM

## 2024-05-17 ENCOUNTER — Encounter: Payer: Self-pay | Admitting: Student in an Organized Health Care Education/Training Program

## 2024-05-17 ENCOUNTER — Ambulatory Visit: Admitting: Student in an Organized Health Care Education/Training Program

## 2024-05-17 VITALS — BP 120/80 | HR 54 | Temp 98.7°F | Ht 72.0 in | Wt 202.8 lb

## 2024-05-17 DIAGNOSIS — G4719 Other hypersomnia: Secondary | ICD-10-CM | POA: Diagnosis not present

## 2024-05-17 DIAGNOSIS — R0602 Shortness of breath: Secondary | ICD-10-CM
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
## 2024-05-17 NOTE — Patient Instructions (Signed)
  VISIT SUMMARY: Today, you were seen for sleep disturbances and shortness of breath. You have been experiencing significant sleep disturbances, including waking up frequently at night and heavy snoring, which may indicate sleep apnea. You also reported shortness of breath, especially during physical activities. We discussed the next steps to diagnose and manage these issues.  YOUR PLAN: -SUSPECTED OBSTRUCTIVE SLEEP APNEA: Obstructive sleep apnea is a condition where your airway becomes blocked during sleep, causing breathing interruptions. We have ordered a home sleep study to confirm this diagnosis. If the home study is inconclusive, we may consider an in-lab sleep study. If sleep apnea is confirmed, you will be referred to a sleep specialist for a CPAP machine, which helps keep your airway open during sleep.  -EXERTIONAL DYSPNEA: Exertional dyspnea means experiencing shortness of breath during physical activities. We have ordered a pulmonary function test to better understand your lung function. If asthma is confirmed, we may consider prescribing an inhaler to help manage your symptoms.  INSTRUCTIONS: Please complete the home sleep study as ordered. If the results are inconclusive, we will discuss the possibility of an in-lab sleep study. Additionally, please complete the pulmonary function test to assess your lung function. Follow up with us  after completing these tests to discuss the results and next steps.     Contains text generated by Abridge.

## 2024-05-20 ENCOUNTER — Ambulatory Visit

## 2024-05-21 ENCOUNTER — Ambulatory Visit: Admitting: Student in an Organized Health Care Education/Training Program

## 2024-06-02 ENCOUNTER — Encounter

## 2024-06-06 DIAGNOSIS — M459 Ankylosing spondylitis of unspecified sites in spine: Secondary | ICD-10-CM | POA: Diagnosis not present

## 2024-06-13 DIAGNOSIS — R400 Somnolence: Secondary | ICD-10-CM | POA: Diagnosis not present

## 2024-06-13 DIAGNOSIS — R0683 Snoring: Secondary | ICD-10-CM | POA: Diagnosis not present

## 2024-07-03 ENCOUNTER — Ambulatory Visit: Admitting: Student in an Organized Health Care Education/Training Program

## 2024-07-03 ENCOUNTER — Encounter: Payer: Self-pay | Admitting: Student in an Organized Health Care Education/Training Program

## 2024-07-03 VITALS — BP 130/86 | HR 62 | Temp 97.6°F | Ht 72.0 in | Wt 205.0 lb

## 2024-07-03 DIAGNOSIS — G4719 Other hypersomnia: Secondary | ICD-10-CM | POA: Diagnosis not present

## 2024-07-03 DIAGNOSIS — G4733 Obstructive sleep apnea (adult) (pediatric): Secondary | ICD-10-CM | POA: Diagnosis not present

## 2024-07-03 DIAGNOSIS — R0609 Other forms of dyspnea: Secondary | ICD-10-CM | POA: Diagnosis not present

## 2024-07-03 NOTE — Patient Instructions (Signed)
" °  VISIT SUMMARY: Today, we discussed the results of your recent home sleep study, which indicated mild obstructive sleep apnea. You have been experiencing significant difficulty staying asleep, waking up frequently throughout the night. We also talked about your ongoing issues with insomnia and the next steps to address these problems.  YOUR PLAN: -OBSTRUCTIVE SLEEP APNEA: Obstructive sleep apnea is a condition where your airway becomes blocked during sleep, causing breathing pauses and oxygen desaturation. We have prescribed a CPAP mask for you to use at night, which will help keep your airway open and improve your sleep quality. We will arrange for a CPAP mask fitting and provision.  -INSOMNIA WITH SLEEP MAINTENANCE DIFFICULTY: Insomnia with sleep maintenance difficulty means you have trouble staying asleep throughout the night. This condition is likely worsened by your obstructive sleep apnea. By addressing your sleep apnea with CPAP therapy, we aim to improve your overall sleep quality.  INSTRUCTIONS: Please schedule an appointment for a CPAP mask fitting. Use the CPAP mask every night as prescribed. Follow up with us  after you have started using the CPAP mask to discuss your progress and any further steps.                      Contains text generated by Abridge.                                 Contains text generated by Abridge.   "
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
## 2024-07-09 ENCOUNTER — Other Ambulatory Visit: Payer: Self-pay | Admitting: Student in an Organized Health Care Education/Training Program

## 2024-07-09 DIAGNOSIS — Z23 Encounter for immunization: Secondary | ICD-10-CM | POA: Diagnosis not present

## 2024-07-22 ENCOUNTER — Other Ambulatory Visit: Payer: Self-pay | Admitting: Family

## 2024-07-22 DIAGNOSIS — I10 Essential (primary) hypertension: Secondary | ICD-10-CM

## 2024-07-23 ENCOUNTER — Other Ambulatory Visit: Payer: Self-pay

## 2024-07-23 ENCOUNTER — Emergency Department: Admission: EM | Admit: 2024-07-23 | Discharge: 2024-07-24 | Disposition: A | Discharge status: Home / Self Care

## 2024-07-23 DIAGNOSIS — E119 Type 2 diabetes mellitus without complications: Secondary | ICD-10-CM | POA: Diagnosis not present

## 2024-07-23 DIAGNOSIS — I10 Essential (primary) hypertension: Secondary | ICD-10-CM | POA: Diagnosis not present

## 2024-07-23 DIAGNOSIS — T5891XA Toxic effect of carbon monoxide from unspecified source, accidental (unintentional), initial encounter: Secondary | ICD-10-CM | POA: Diagnosis present

## 2024-07-23 LAB — COOXEMETRY PANEL
Carboxyhemoglobin: 4.7 % — ABNORMAL HIGH (ref 0.5–1.5)
Methemoglobin: 0.7 % (ref 0.0–1.5)
O2 Saturation: 90.5 %
Total hemoglobin: 13.6 g/dL (ref 12.0–16.0)
Total oxygen content: 61.4 %
Total oxygen content: 85.6 %

## 2024-07-23 LAB — CBC
HCT: 42.5 % (ref 39.0–52.0)
Hemoglobin: 14.2 g/dL (ref 13.0–17.0)
MCH: 29.5 pg (ref 26.0–34.0)
MCHC: 33.4 g/dL (ref 30.0–36.0)
MCV: 88.2 fL (ref 80.0–100.0)
Platelets: 308 10*3/uL (ref 150–400)
RBC: 4.82 MIL/uL (ref 4.22–5.81)
RDW: 12.8 % (ref 11.5–15.5)
WBC: 9.4 10*3/uL (ref 4.0–10.5)
nRBC: 0 % (ref 0.0–0.2)

## 2024-07-23 LAB — BASIC METABOLIC PANEL WITH GFR
Anion gap: 13 (ref 5–15)
BUN: 21 mg/dL (ref 8–23)
CO2: 28 mmol/L (ref 22–32)
Calcium: 9.4 mg/dL (ref 8.9–10.3)
Chloride: 98 mmol/L (ref 98–111)
Creatinine, Ser: 0.91 mg/dL (ref 0.61–1.24)
GFR, Estimated: >60 mL/min
Glucose, Bld: 119 mg/dL — ABNORMAL HIGH (ref 70–99)
Potassium: 3.5 mmol/L (ref 3.5–5.1)
Sodium: 138 mmol/L (ref 135–145)

## 2024-07-23 NOTE — ED Notes (Signed)
 CO 24 per RT, place on NRB. Dr Clarine notified

## 2024-07-23 NOTE — ED Triage Notes (Signed)
 Pt was in his shop working with his truck running and became light headed. Pt says the last time this happened he had carbon monoxide poisoning.

## 2024-07-23 NOTE — ED Provider Notes (Signed)
 "  [facility] Provider Note    Event Date/Time   First MD Initiated Contact with Patient 07/23/24 1847     (approximate)   History   Toxic Inhalation  Pt was in his shop working with his truck running and became light headed. Pt says the last time this happened he had carbon monoxide poisoning.    HPI Franklin RIDEAUX is a 64 y.o. male PMH hypertension, hyperlipidemia, diabetes, RA, prior TBI presents for evaluation of concern of car monoxide poisoning -Patient was working on a car in a garage with friends.  Car was running and they had the door closed for about 20 minutes.  Started feeling some lightheadedness.  Has had carbon oxide poisoning before so came to emergency department for eval.     Physical Exam   Triage Vital Signs: ED Triage Vitals [07/23/24 1742]  Encounter Vitals Group     BP (!) 175/89     Girls Systolic BP Percentile      Girls Diastolic BP Percentile      Boys Systolic BP Percentile      Boys Diastolic BP Percentile      Pulse Rate 84     Resp 17     Temp 98.1 F (36.7 C)     Temp Source Oral     SpO2 98 %     Weight 205 lb 0.4 oz (93 kg)     Height 6' (1.829 m)     Head Circumference      Peak Flow      Pain Score 0     Pain Loc      Pain Education      Exclude from Growth Chart     Most recent vital signs: Vitals:   07/23/24 1742  BP: (!) 175/89  Pulse: 84  Resp: 17  Temp: 98.1 F (36.7 C)  SpO2: 98%    {Only need to document appropriate and relevant physical exam:1} General: Awake, no distress. *** CV:  Good peripheral perfusion. RRR, RP 2+*** Resp:  Normal effort. CTAB*** Abd:  No distention. Nontender to deep palpation throughout*** Other:  ***   ED Results / Procedures / Treatments   Labs (all labs ordered are listed, but only abnormal results are displayed) Labs Reviewed  COOXEMETRY PANEL - Abnormal; Notable for the following components:      Result Value   Carboxyhemoglobin 24.1 (*)    All other  components within normal limits  BASIC METABOLIC PANEL WITH GFR - Abnormal; Notable for the following components:   Glucose, Bld 119 (*)    All other components within normal limits  CBC     EKG  ***   RADIOLOGY *** {USE THE WORD INTERPRETED!! You MUST document your own interpretation of imaging, as well as the fact that you reviewed the radiologist's report!:1}   PROCEDURES:  Critical Care performed: {CriticalCareYesNo:19197::Yes, see critical care procedure note(s),No}  Procedures   MEDICATIONS ORDERED IN ED: Medications - No data to display   IMPRESSION / MDM / ASSESSMENT AND PLAN / ED COURSE  I reviewed the triage vital signs and the nursing notes.                              DDX/MDM/AP: Differential diagnosis includes, but is not limited to, ***  Plan: - ***  Patient's presentation is most consistent with {EM COPA:27473}  ***The patient is on the cardiac monitor to  evaluate for evidence of arrhythmia and/or significant heart rate changes.***  ED course below. ***      FINAL CLINICAL IMPRESSION(S) / ED DIAGNOSES   Final diagnoses:  None     Rx / DC Orders   ED Discharge Orders     None        Note:  This document was prepared using Dragon voice recognition software and may include unintentional dictation errors. "

## 2024-07-23 NOTE — ED Provider Notes (Incomplete)
 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 Pt says the last time this happened he had carbon monoxide poisoning.    HPI Franklin Richardson is a 64 y.o. male PMH hypertension, hyperlipidemia, diabetes, RA, prior TBI presents for evaluation of concern of car monoxide poisoning -Patient was working on a car in a garage with friends.  Car was running and they had the door closed for about 20 minutes.  Started feeling some lightheadedness.  Has had carbon oxide poisoning before so came to emergency department for eval.     Physical Exam   Triage Vital Signs: ED Triage Vitals [07/23/24 1742]  Encounter Vitals Group     BP (!) 175/89     Girls Systolic BP Percentile      Girls Diastolic BP Percentile      Boys Systolic BP Percentile      Boys Diastolic BP Percentile      Pulse Rate 84     Resp 17     Temp 98.1 F (36.7 C)     Temp Source Oral     SpO2 98 %     Weight 205 lb 0.4 oz (93 kg)     Height 6' (1.829 m)     Head Circumference      Peak Flow      Pain Score 0     Pain Loc      Pain Education      Exclude from Growth Chart     Most recent vital signs: Vitals:   07/23/24 1742  BP: (!) 175/89  Pulse: 84  Resp: 17  Temp: 98.1 F (36.7 C)  SpO2: 98%     General: Awake, no distress.  CV:  Good peripheral perfusion. RRR, RP 2+ Resp:  Normal effort. CTAB Abd:  No distention. Nontender to deep palpation throughout Neuro:  Aox4, face symmetric, moving all extremities spontaneously, no focal motor deficit appreciated   ED Results / Procedures / Treatments   Labs (all labs ordered are listed, but only abnormal results are displayed) Labs Reviewed  COOXEMETRY PANEL - Abnormal; Notable for the following components:      Result Value   Carboxyhemoglobin 24.1  (*)    All other components within normal limits  BASIC METABOLIC PANEL WITH GFR - Abnormal; Notable for the following components:   Glucose, Bld 119 (*)    All other components within normal limits  CBC     EKG  N/a   RADIOLOGY N/a    PROCEDURES:  Critical Care performed: Yes, see critical care procedure note(s)  Procedures   MEDICATIONS ORDERED IN ED: Medications - No data to display   IMPRESSION / MDM / ASSESSMENT AND PLAN / ED COURSE  I reviewed the triage vital signs and the nursing notes.                              DDX/MDM/AP: Differential diagnosis includes, but is not limited to, ***  Plan: - ***  Patient's presentation is most consistent with {EM COPA:27473}  ***The patient is on the cardiac monitor to evaluate for evidence of arrhythmia and/or significant heart rate changes.***  ED course below. ***  Clinical Course as  of 07/23/24 2353  Tue Jul 23, 2024  2004 CBC, BMP reviewed, unremarkable [MM]  2158 Carboxyhemoglobin(!): 4.7 Hemoglobin notably downtrending on repeat, 4.7, was 24.1 [MM]    Clinical Course User Index [MM] Clarine Ozell LABOR, MD     FINAL CLINICAL IMPRESSION(S) / ED DIAGNOSES   Final diagnoses:  None     Rx / DC Orders   ED Discharge Orders     None        Note:  This document was prepared using Dragon voice recognition software and may include unintentional dictation errors. "

## 2024-07-23 NOTE — Discharge Instructions (Signed)
 Your evaluation in the emergency department was notable for carbon oxide poisoning, and this is treated with supplemental oxygen.  Your blood levels normalized and your symptoms resolved.  Please follow-up with your primary care provider for reevaluation, return to the emergency department with any new or worsening symptoms.

## 2024-07-24 ENCOUNTER — Telehealth: Payer: Self-pay

## 2024-07-31 LAB — COOXEMETRY PANEL
Carboxyhemoglobin: 24.1 % (ref 0.5–1.5)
O2 Saturation: 80.9 %
Total hemoglobin: 14.4 g/dL (ref 12.0–16.0)
Total oxygen content: 61.4 %

## 2024-08-05 ENCOUNTER — Encounter

## 2024-08-05 ENCOUNTER — Ambulatory Visit: Admitting: Student in an Organized Health Care Education/Training Program
